# Patient Record
Sex: Female | Born: 1940 | Hispanic: No | Marital: Married | Smoking: Never smoker
Health system: Southern US, Community
[De-identification: ages and names within clinical notes are randomized; demographics above are authoritative.]

## PROBLEM LIST (undated history)

## (undated) DIAGNOSIS — K219 Gastro-esophageal reflux disease without esophagitis: Secondary | ICD-10-CM

## (undated) DIAGNOSIS — IMO0002 Reserved for concepts with insufficient information to code with codable children: Secondary | ICD-10-CM

## (undated) DIAGNOSIS — R9430 Abnormal result of cardiovascular function study, unspecified: Secondary | ICD-10-CM

## (undated) DIAGNOSIS — S322XXA Fracture of coccyx, initial encounter for closed fracture: Secondary | ICD-10-CM

## (undated) DIAGNOSIS — M1990 Unspecified osteoarthritis, unspecified site: Secondary | ICD-10-CM

## (undated) DIAGNOSIS — D649 Anemia, unspecified: Secondary | ICD-10-CM

## (undated) DIAGNOSIS — Q2730 Arteriovenous malformation, site unspecified: Secondary | ICD-10-CM

## (undated) DIAGNOSIS — E7800 Pure hypercholesterolemia, unspecified: Secondary | ICD-10-CM

## (undated) DIAGNOSIS — I358 Other nonrheumatic aortic valve disorders: Secondary | ICD-10-CM

## (undated) DIAGNOSIS — H9192 Unspecified hearing loss, left ear: Secondary | ICD-10-CM

## (undated) DIAGNOSIS — J189 Pneumonia, unspecified organism: Secondary | ICD-10-CM

## (undated) DIAGNOSIS — F419 Anxiety disorder, unspecified: Secondary | ICD-10-CM

## (undated) DIAGNOSIS — Z8719 Personal history of other diseases of the digestive system: Secondary | ICD-10-CM

## (undated) DIAGNOSIS — I10 Essential (primary) hypertension: Secondary | ICD-10-CM

## (undated) DIAGNOSIS — I340 Nonrheumatic mitral (valve) insufficiency: Secondary | ICD-10-CM

## (undated) HISTORY — DX: Nonrheumatic mitral (valve) insufficiency: I34.0

## (undated) HISTORY — DX: Other nonrheumatic aortic valve disorders: I35.8

## (undated) HISTORY — DX: Pure hypercholesterolemia, unspecified: E78.00

## (undated) HISTORY — DX: Arteriovenous malformation, site unspecified: Q27.30

## (undated) HISTORY — PX: OTHER SURGICAL HISTORY: SHX169

## (undated) HISTORY — DX: Anemia, unspecified: D64.9

## (undated) HISTORY — PX: CHOLECYSTECTOMY: SHX55

## (undated) HISTORY — PX: TONSILLECTOMY: SUR1361

## (undated) HISTORY — DX: Fracture of coccyx, initial encounter for closed fracture: S32.2XXA

## (undated) HISTORY — DX: Anxiety disorder, unspecified: F41.9

## (undated) HISTORY — DX: Abnormal result of cardiovascular function study, unspecified: R94.30

## (undated) HISTORY — DX: Essential (primary) hypertension: I10

## (undated) HISTORY — DX: Reserved for concepts with insufficient information to code with codable children: IMO0002

---

## 1965-09-28 DIAGNOSIS — J189 Pneumonia, unspecified organism: Secondary | ICD-10-CM

## 1965-09-28 HISTORY — DX: Pneumonia, unspecified organism: J18.9

## 1999-07-15 ENCOUNTER — Encounter: Admission: RE | Admit: 1999-07-15 | Discharge: 1999-10-13 | Payer: Self-pay | Admitting: Anesthesiology

## 1999-08-08 ENCOUNTER — Encounter: Admission: RE | Admit: 1999-08-08 | Discharge: 1999-08-08 | Payer: Self-pay | Admitting: Family Medicine

## 1999-08-12 ENCOUNTER — Other Ambulatory Visit: Admission: RE | Admit: 1999-08-12 | Discharge: 1999-08-12 | Payer: Self-pay | Admitting: Obstetrics and Gynecology

## 2000-04-22 ENCOUNTER — Encounter (INDEPENDENT_AMBULATORY_CARE_PROVIDER_SITE_OTHER): Payer: Self-pay

## 2000-04-22 ENCOUNTER — Other Ambulatory Visit: Admission: RE | Admit: 2000-04-22 | Discharge: 2000-04-22 | Payer: Self-pay | Admitting: Obstetrics and Gynecology

## 2000-09-08 ENCOUNTER — Other Ambulatory Visit: Admission: RE | Admit: 2000-09-08 | Discharge: 2000-09-08 | Payer: Self-pay | Admitting: Obstetrics and Gynecology

## 2001-06-23 ENCOUNTER — Encounter: Admission: RE | Admit: 2001-06-23 | Discharge: 2001-06-23 | Payer: Self-pay | Admitting: Orthopedic Surgery

## 2001-06-23 ENCOUNTER — Encounter: Payer: Self-pay | Admitting: Orthopedic Surgery

## 2001-08-11 ENCOUNTER — Encounter: Payer: Self-pay | Admitting: Orthopedic Surgery

## 2001-08-12 ENCOUNTER — Inpatient Hospital Stay (HOSPITAL_COMMUNITY): Admission: RE | Admit: 2001-08-12 | Discharge: 2001-08-13 | Payer: Self-pay | Admitting: Orthopedic Surgery

## 2001-10-27 ENCOUNTER — Other Ambulatory Visit: Admission: RE | Admit: 2001-10-27 | Discharge: 2001-10-27 | Payer: Self-pay | Admitting: Obstetrics and Gynecology

## 2002-12-12 ENCOUNTER — Other Ambulatory Visit: Admission: RE | Admit: 2002-12-12 | Discharge: 2002-12-12 | Payer: Self-pay | Admitting: Obstetrics and Gynecology

## 2003-03-05 ENCOUNTER — Encounter: Admission: RE | Admit: 2003-03-05 | Discharge: 2003-03-05 | Payer: Self-pay | Admitting: Gastroenterology

## 2003-03-05 ENCOUNTER — Encounter: Payer: Self-pay | Admitting: Gastroenterology

## 2003-03-26 ENCOUNTER — Encounter (INDEPENDENT_AMBULATORY_CARE_PROVIDER_SITE_OTHER): Payer: Self-pay | Admitting: *Deleted

## 2003-03-26 ENCOUNTER — Ambulatory Visit (HOSPITAL_COMMUNITY): Admission: RE | Admit: 2003-03-26 | Discharge: 2003-03-26 | Payer: Self-pay | Admitting: Gastroenterology

## 2004-01-24 ENCOUNTER — Other Ambulatory Visit: Admission: RE | Admit: 2004-01-24 | Discharge: 2004-01-24 | Payer: Self-pay | Admitting: Obstetrics and Gynecology

## 2005-03-11 ENCOUNTER — Other Ambulatory Visit: Admission: RE | Admit: 2005-03-11 | Discharge: 2005-03-11 | Payer: Self-pay | Admitting: Obstetrics and Gynecology

## 2005-10-02 ENCOUNTER — Ambulatory Visit: Payer: Self-pay | Admitting: Cardiology

## 2006-07-02 ENCOUNTER — Ambulatory Visit: Payer: Self-pay | Admitting: Cardiology

## 2006-07-12 ENCOUNTER — Ambulatory Visit: Payer: Self-pay | Admitting: Cardiology

## 2008-07-11 ENCOUNTER — Ambulatory Visit: Payer: Self-pay | Admitting: Cardiology

## 2008-09-28 HISTORY — PX: TOTAL KNEE ARTHROPLASTY: SHX125

## 2008-12-26 ENCOUNTER — Inpatient Hospital Stay (HOSPITAL_COMMUNITY): Admission: RE | Admit: 2008-12-26 | Discharge: 2008-12-30 | Payer: Self-pay | Admitting: Orthopedic Surgery

## 2009-01-01 ENCOUNTER — Telehealth: Payer: Self-pay | Admitting: Internal Medicine

## 2009-11-21 ENCOUNTER — Telehealth: Payer: Self-pay | Admitting: Cardiology

## 2010-01-08 ENCOUNTER — Telehealth: Payer: Self-pay | Admitting: Cardiology

## 2010-02-11 ENCOUNTER — Telehealth: Payer: Self-pay | Admitting: Cardiology

## 2010-02-21 ENCOUNTER — Encounter: Payer: Self-pay | Admitting: Cardiology

## 2010-02-21 DIAGNOSIS — I10 Essential (primary) hypertension: Secondary | ICD-10-CM | POA: Insufficient documentation

## 2010-02-21 DIAGNOSIS — E7800 Pure hypercholesterolemia, unspecified: Secondary | ICD-10-CM

## 2010-02-21 DIAGNOSIS — F411 Generalized anxiety disorder: Secondary | ICD-10-CM | POA: Insufficient documentation

## 2010-02-21 DIAGNOSIS — Z87448 Personal history of other diseases of urinary system: Secondary | ICD-10-CM | POA: Insufficient documentation

## 2010-02-25 ENCOUNTER — Ambulatory Visit: Payer: Self-pay | Admitting: Cardiology

## 2010-04-25 ENCOUNTER — Encounter: Payer: Self-pay | Admitting: Cardiology

## 2010-06-27 ENCOUNTER — Telehealth: Payer: Self-pay | Admitting: Cardiology

## 2010-10-26 ENCOUNTER — Encounter
Admission: RE | Admit: 2010-10-26 | Discharge: 2010-10-26 | Payer: Self-pay | Source: Home / Self Care | Attending: Otolaryngology | Admitting: Otolaryngology

## 2010-10-30 NOTE — Progress Notes (Signed)
Summary: REFILL MEDS  Phone Note Refill Request Call back at Home Phone [phone] Message from:  Patient on Feb 11, 2010 1:10 PM  Refills Requested: Medication #1:  TRIAMTERENE-HCTZ 37.5-25 MG TABS Take 1 tablet by mouth once a day.  Medication #2:  ACCUPRIL 20 MG TABS Take 1 tablet by mouth once a day [facility] [address] [phone]    Method Requested: Fax to Local Pharmacy Initial call taken by: Lorne Skeens,  Feb 11, 2010 1:11 PM    Prescriptions: TRIAMTERENE-HCTZ 37.5-25 MG TABS (TRIAMTERENE-HCTZ) Take 1 tablet by mouth once a day  #30 x 3   Entered by:   Hardin Negus, RMA   Authorized by:   Talitha Givens, MD, [facility]   Signed by:   Hardin Negus, RMA on 02/12/2010   Method used:   Electronically to        [facility] ([facility]       [address]       [place], [place]  [address]       Ph: [phone] or [phone]       Fax: [phone]   RxID:   [id number] ACCUPRIL 20 MG TABS (QUINAPRIL HCL) Take 1 tablet by mouth once a day  #30 x 3   Entered by:   Hardin Negus, RMA   Authorized by:   Talitha Givens, MD, [facility]   Signed by:   Hardin Negus, RMA on 02/12/2010   Method used:   Electronically to        [facility] (retail)       [address]       [place], [place]  [address]       Ph: [phone] or [phone]       Fax: [phone]   RxID:   [id number]

## 2010-10-30 NOTE — Progress Notes (Signed)
Summary: pt needs meds asap [place]   Phone Note Refill Request Call back at [facility] Phone [phone] Message from:  [facility]  Refills Requested: Medication #1:  TRIAMTERENE-HCTZ 37.5-25 MG TABS Take 1 tablet by mouth once a day. Initial call taken by: Omer Jack,  January 08, 2010 3:49 PM    Prescriptions: TRIAMTERENE-HCTZ 37.5-25 MG TABS (TRIAMTERENE-HCTZ) Take 1 tablet by mouth once a day  #90 x 1   Entered by:   Hardin Negus, RMA   Authorized by:   Talitha Givens, MD, [facility]   Signed by:   Hardin Negus, RMA on 01/08/2010   Method used:   Faxed to ...       [facility]             , [place]         Ph:        Fax: [phone]   RxID:   [id number]

## 2010-10-30 NOTE — Miscellaneous (Signed)
  Clinical Lists Changes  Problems: Added new problem of CHEST PAIN (ICD-786.50) Observations: Added new observation of PAST MED HX:  1. Hypertension.   2. Hypercholesterolemia.   3. Anxiety.   4. History of urinary tract infection.  Family history coronary disease Chest pain... stress nuclear... 2005.... no significant abnormalities    (02/21/2010 14:48)       Past History:  Past Medical History:  1. Hypertension.   2. Hypercholesterolemia.   3. Anxiety.   4. History of urinary tract infection.  Family history coronary disease Chest pain... stress nuclear... 2005.... no significant abnormalities

## 2010-10-30 NOTE — Progress Notes (Signed)
Summary: c/o lightheadness, sweaty  Phone Note Call from Patient Call back at Home Phone [phone]   Caller: Patient Reason for Call: Talk to Nurse Summary of Call: c/o lightheadness, sweaty, uneasy, no chestpain,  Initial call taken by: Lorne Skeens,  June 27, 2010 3:15 PM  Follow-up for Phone Call        Pt. felt lightheaded and flushed in face today while shopping.  No chest pain, numbness, SOB, nausea or sweating associated with this.  Drove self home and felt better after getting home.  States this happened last month and also on September 15. Previous 2 episodes associated with vomiting. She saw GI MD and was treated with Xifaxan.  Pt states blood pressure when last checked at MD's office was 130/78.  I asked her to contact her primary MD to be evaluated but she also wants appt with Dr. Myrtis Ser. Offered pt appt with Dr. Myrtis Ser on October 13th at 9 AM but she would like earlier appt.  Will forward to [facility] to see if she can schedule pt appt with Dr. Myrtis Ser.  Pt aware Herbert Seta is not in office today.  I instructed pt if lightheadedness were to increase or she had chest pain or facial or extremity numbness or tingling she should go to ED at [facility] to be seen. Dossie Arbour, RN, BSN  June 27, 2010 3:55 PM   Additional Follow-up for Phone Call Additional follow up Details #1::        spoke w/pt, she saw pcp today and had EKG they felt it was not her heart but acid reflux, and started her on med, if symptoms return she will call us back  Additional Follow-up by: Meredith Staggers, RN,  June 30, 2010 5:05 PM

## 2010-10-30 NOTE — Progress Notes (Signed)
Summary: refill meds,  mail to pt  Phone Note Refill Request Call back at Home Phone [phone] Message from:  Patient on November 21, 2009 11:57 AM  Atenolol 50 ; triamt/hctz 37.5 / 25 mg;  Accupril 20   Method Requested: Mail to Patient Initial call taken by: Lorne Skeens,  November 21, 2009 11:59 AM  Follow-up for Phone Call        tried to call to find out 30 or 90 day and if I could go a head and send it 2 the pharmacy Hardin Negus, RMA  November 21, 2009 2:55 PM   Pt called back -- sending 42 script with 1 refill will send a yr worth when she comes in on 3/7 Follow-up by: Hardin Negus, RMA,  November 21, 2009 3:20 PM    New/Updated Medications: ATENOLOL 50 MG TABS (ATENOLOL) Take one tablet by mouth daily ACCUPRIL 20 MG TABS (QUINAPRIL HCL) Take 1 tablet by mouth once a day TRIAMTERENE-HCTZ 37.5-25 MG TABS (TRIAMTERENE-HCTZ) Take 1 tablet by mouth once a day Prescriptions: TRIAMTERENE-HCTZ 37.5-25 MG TABS (TRIAMTERENE-HCTZ) Take 1 tablet by mouth once a day  #90 x 1   Entered by:   Hardin Negus, RMA   Authorized by:   Talitha Givens, MD, [facility]   Signed by:   Hardin Negus, RMA on 11/21/2009   Method used:   Faxed to ...       [facility]             , [place]         Ph:        Fax: [phone]   RxID:   [id number] ACCUPRIL 20 MG TABS (QUINAPRIL HCL) Take 1 tablet by mouth once a day  #90 x 1   Entered by:   Hardin Negus, RMA   Authorized by:   Talitha Givens, MD, [facility]   Signed by:   Hardin Negus, RMA on 11/21/2009   Method used:   Faxed to ...       [facility]order)             , [place]         Ph:        Fax: [phone]   RxID:   [id number] ATENOLOL 50 MG TABS (ATENOLOL) Take one tablet by mouth daily  #90 x 1   Entered by:   Hardin Negus, RMA   Authorized by:   Talitha Givens, MD, [facility]   Signed by:   Hardin Negus, RMA on 11/21/2009   Method  used:   Faxed to ...       [facility]order)             , [place]         Ph:        Fax: [phone]   RxID:   [id number]

## 2010-10-30 NOTE — Assessment & Plan Note (Signed)
Summary: rov/ gd   Visit Type:  Follow-up Primary Provider:  Marjory Lies, MD  CC:  chest pain.  History of Present Illness: The patient is seen for followup with history of chest pain and hypertension.  She is done well.  She is not having any significant chest pain.  She had total knee replacement and this has helped her be much more active.  At home her blood pressure is normal.  She admits that she ate a lot of salt recently and has felt a little swollen.  Her blood pressure reflects this today as it is elevated.  Current Medications (verified): 1)  Atenolol 50 Mg Tabs (Atenolol) .... Take One Tablet By Mouth Daily 2)  Accupril 20 Mg Tabs (Quinapril Hcl) .... Take 1 Tablet By Mouth Once A Day 3)  Triamterene-Hctz 37.5-25 Mg Tabs (Triamterene-Hctz) .... Take 1 Tablet By Mouth Once A Day 4)  Lipitor 20 Mg Tabs (Atorvastatin Calcium) .Marland Kitchen.. 1 By Mouth Daily 5)  Aspirin 81 Mg  Tabs (Aspirin) .Marland Kitchen.. 1 By Mouth Daily 6)  Vitamin C 500 Mg  Tabs (Ascorbic Acid) .Marland Kitchen.. 1 By Mouth Daily  Allergies (verified): No Known Drug Allergies  Past History:  Past Medical History: Last updated: 02/21/2010  1. Hypertension.   2. Hypercholesterolemia.   3. Anxiety.   4. History of urinary tract infection.  Family history coronary disease Chest pain... stress nuclear... 2005.... no significant abnormalities     Review of Systems       Patient denies fever, chills, headache, sweats, rash, change in vision, change in hearing, chest pain, cough, nausea vomiting, urinary symptoms.  All other systems are reviewed and are negative.  Vital Signs:  Patient profile:   70 year old female Height:      61 inches Weight:      159 pounds BMI:     30.15 Pulse rate:   86 / minute Resp:     16 per minute BP sitting:   162 / 88  (left arm)  Vitals Entered By: Marrion Coy, CNA (Feb 25, 2010 12:09 PM)  Physical Exam  General:  patient is overweight but stable. Eyes:  no xanthelasma. Neck:  no jugular  venous distention. Lungs:  lungs are clear.  Respiratory effort is nonlabored. Heart:  cardiac exam reveals S1-S2.  There are no clicks or significant murmurs. Abdomen:  abdomen is soft. Extremities:  no peripheral edema. Psych:  patient is oriented to person time and place.  Affect is normal.   Impression & Recommendations:  Problem # 1:  CHEST PAIN (ICD-786.50)  Her updated medication list for this problem includes:    Atenolol 50 Mg Tabs (Atenolol) .Marland Kitchen... Take one tablet by mouth daily    Accupril 20 Mg Tabs (Quinapril hcl) .Marland Kitchen... Take 1 tablet by mouth once a day    Aspirin 81 Mg Tabs (Aspirin) .Marland Kitchen... 1 by mouth daily The patient has had no recurring chest pain.  EKG is normal.  It is reviewed by me.  Orders: EKG w/ Interpretation (93000) T-2 View CXR (71020TC)  Problem # 2:  HYPERTENSION (ICD-401.9)  Her updated medication list for this problem includes:    Atenolol 50 Mg Tabs (Atenolol) .Marland Kitchen... Take one tablet by mouth daily    Accupril 20 Mg Tabs (Quinapril hcl) .Marland Kitchen... Take 1 tablet by mouth once a day    Triamterene-hctz 37.5-25 Mg Tabs (Triamterene-hctz) .Marland Kitchen... Take 1 tablet by mouth once a day    Aspirin 81 Mg Tabs (Aspirin) .Marland Kitchen... 1 by  mouth daily Blood pressure is higher than I would like to see it today.  She admits to increased salt intake.  She will call her salt back and she will check her blood pressure at home.  She does have a blood pressure cuff.  Cardiac status is stable.  Patient has not had a chest x-ray in approximately 8 years.  This will be arranged.  Patient Instructions: 1)  A chest x-ray takes a picture of the organs and structures inside the chest, including the heart, lungs, and blood vessels. This test can show several things, including, whether the heart is enlarged; whether fluid is building up in the lungs; and whether pacemaker / defibrillator leads are still in place. 2)  Your physician wants you to follow-up in:   1 year.  You will receive a reminder  letter in the mail two months in advance. If you don't receive a letter, please call our office to schedule the follow-up appointment. Prescriptions: TRIAMTERENE-HCTZ 37.5-25 MG TABS (TRIAMTERENE-HCTZ) Take 1 tablet by mouth once a day  #90 x 3   Entered by:   Meredith Staggers, RN   Authorized by:   Talitha Givens, MD, [facility]   Signed by:   Meredith Staggers, RN on 02/25/2010   Method used:   Print then Give to Patient   RxID:   [id number] TRIAMTERENE-HCTZ 37.5-25 MG TABS (TRIAMTERENE-HCTZ) Take 1 tablet by mouth once a day  #30 x 6   Entered by:   Meredith Staggers, RN   Authorized by:   Talitha Givens, MD, [facility]   Signed by:   Meredith Staggers, RN on 02/25/2010   Method used:   Print then Give to Patient   RxID:   [id number] ACCUPRIL 20 MG TABS (QUINAPRIL HCL) Take 1 tablet by mouth once a day  #90 x 3   Entered by:   Meredith Staggers, RN   Authorized by:   Talitha Givens, MD, [facility]   Signed by:   Meredith Staggers, RN on 02/25/2010   Method used:   Faxed to ...       [facility]order[facility]             , [place]         Ph:        Fax: [phone]   RxID:   [id number] ATENOLOL 50 MG TABS (ATENOLOL) Take one tablet by mouth daily  #90 x 3   Entered by:   Meredith Staggers, RN   Authorized by:   Talitha Givens, MD, [facility]   Signed by:   Meredith Staggers, RN on 02/25/2010   Method used:   Faxed to ...       [facility]             , [place]         Ph:        Fax: [phone]   RxID:   [id number]

## 2010-11-03 ENCOUNTER — Telehealth: Payer: Self-pay | Admitting: Cardiology

## 2010-11-13 NOTE — Progress Notes (Signed)
Summary: pt has washing sound in her ear  Phone Note Call from Patient Call back at Home Phone [phone]   Caller: Patient Reason for Call: Talk to Nurse, Talk to Doctor Summary of Call: pt is having a washing sound in her ear and she went to a ENT provider and had a mri done and it was ok so she was reading on the internet that it could be the blood flow and she wants to talk to someone about/lg Initial call taken by: Omer Jack,  November 03, 2010 4:07 PM  Follow-up for Phone Call        also saw allergist and was put on prednisone last Thur., she cont. to have a sound in her Left ear "bum, bum, bum" has been going on since last Mon., she states MRI was ok, BP ok, wants to know if Dr Myrtis Ser has any thoughts will review w/him tom Meredith Staggers, RN  November 03, 2010 5:47 PM   Additional Follow-up for Phone Call Additional follow up Details #1::        This sound in her ear is not from her heart or blood vessels.  I would be reassuring from my viewpoint.  I will be happy to see her for elective visit to followup.  I would recommend giving the prednisone a  chance to work.  Myrtis Ser  pt is aware Meredith Staggers, RN  November 04, 2010 1:58 PM

## 2010-11-21 ENCOUNTER — Other Ambulatory Visit: Payer: Self-pay | Admitting: Obstetrics and Gynecology

## 2010-11-27 ENCOUNTER — Telehealth: Payer: Self-pay | Admitting: Cardiology

## 2010-12-01 ENCOUNTER — Encounter: Payer: Self-pay | Admitting: Nurse Practitioner

## 2010-12-01 ENCOUNTER — Ambulatory Visit (INDEPENDENT_AMBULATORY_CARE_PROVIDER_SITE_OTHER): Payer: Self-pay | Admitting: Nurse Practitioner

## 2010-12-01 ENCOUNTER — Ambulatory Visit: Payer: Self-pay | Admitting: Physician Assistant

## 2010-12-01 DIAGNOSIS — I10 Essential (primary) hypertension: Secondary | ICD-10-CM

## 2010-12-04 NOTE — Progress Notes (Signed)
Summary: rapid heart beat  Phone Note Call from Patient Call back at Home Phone [phone]   Caller: Patient Reason for Call: Talk to Nurse Summary of Call: pt states she been having rapid heart beat. pt wants to know if she able to come in tomorrow. Initial call taken by: Roe Coombs,  November 27, 2010 11:49 AM  Follow-up for Phone Call        Pt. would like to be seen in the office soon. Pt states has been having broblems with her left ear. Her GYN said  that pt's pulse was faster in her left carotid artery, and her carotid arteries needed to be evaluated. Pt states the heart beat in her left side of neck is so strong, that keeps her awake during the night. An appointment was made with Alesia Banda PA for Monday 12/01/10 at 3:30 PM. Pt. aware. Follow-up by: Ollen Gross, RN, BSN,  November 27, 2010 2:27 PM

## 2010-12-09 NOTE — Assessment & Plan Note (Signed)
Summary: Cardiology Office Visit - Whooshing in Left Ear   Visit Type:  Follow-up Primary Provider:  Marjory Lies, MD  CC:  "hears" heart beat in ear.  History of Present Illness: 70 year old Asian female with prior history of hypertension.  Since mid January, she has been experiencing a whooshing sensation in her left ear.  She said is more or less constant though when she is not paying attention to it, like when she is white TV, she doesn't necessarily here it.  she has been evaluated by ENT and has already had what she says was an unrevealing MRI.  She has also been on a prednisone taper over the past month.  Despite this, the whooshing sound persists.  She was recently seen by her OB/GYN who listened to her neck and told her that the pulse after left carotid was at a greater rate then the pulse to her right carotid.  To clarify, patient says that the pulse was not palpated at that time just auscultated, and she was last told her breath.  Since that information was given to her, she has been fairly anxious about it has continued to hear the whooshing sound in her left ear.  She presents today for cardiology evaluation for this ongoing problem.  She denies any chest pain, PND, orthopnea, nausea, vomiting, dizziness, syncope, or lower extremity edema.  Further, she reports that her blood pressure is well controlled at home.  EKG  Procedure date:  12/01/2010  Findings:      regular sinus rhythm, 65, no acute ST or T changes   Current Medications (verified): 1)  Atenolol 50 Mg Tabs (Atenolol) .... Take One Tablet By Mouth Daily 2)  Accupril 20 Mg Tabs (Quinapril Hcl) .... Take 1 Tablet By Mouth Once A Day 3)  Triamterene-Hctz 37.5-25 Mg Tabs (Triamterene-Hctz) .... Take 1 Tablet By Mouth Once A Day 4)  Lipitor 20 Mg Tabs (Atorvastatin Calcium) .Marland Kitchen.. 1 By Mouth Daily 5)  Aspirin 81 Mg  Tabs (Aspirin) .Marland Kitchen.. 1 By Mouth Daily 6)  Vitamin C 500 Mg  Tabs (Ascorbic Acid) .Marland Kitchen.. 1 By Mouth Daily 7)   Prednisone 20 Mg Tabs (Prednisone) .... Taper Dose -- Now On 1 Tab -- Tomorrow Start 1/2 Tab For 5 Days Then Appt.  Allergies (verified): No Known Drug Allergies  Review of Systems       as per history of present illness.  She has had some insomnia related to being on a prednisone taper.  She does report anxiety over the swishing sensation in her ear.  Otherwise all systems reviewed and negative  Vital Signs:  Patient profile:   70 year old female Height:      61 inches Weight:      158 pounds BMI:     29.96 Pulse rate:   65 / minute BP sitting:   166 / 90  (left arm) Cuff size:   regular  Vitals Entered By: Hardin Negus, RMA (December 01, 2010 3:57 PM)  Physical Exam  General:  Well developed, well nourished, in no acute distress. Head:  HEENT: Normal Neck:  supple without bruits or JVD Lungs:  respirations regular and unlabored, clear to auscultation Heart:  regular S1, S2, no S3, S4, or murmurs. Abdomen:  round, soft, nontender, nondistended, bowel sounds present x4. Msk:  moves all extremities. Pulses:  pulses normal in all 4 extremities Extremities:  No clubbing or cyanosis.  no edema. Neurologic:  awake alert and oriented x3. Skin:  warm and  dry. Psych:  normal affect.   Impression & Recommendations:  Problem # 1:  HYPERTENSION (ICD-401.9) patient's blood pressure is elevated the office today however she reports it is more normal at home.  Therefore we'll not make any changes to her home regimen.  As for the whooshing sensation she experiences, I offered her reassurance.  There is no objective evidence of bruits or other vascular abnormalities within her neck, specifically on the left side, to explain her symptoms.  It should be noted that when she is not paying attention to the sound, she does not hear it.  She admits to actually turning the TV down or off just to determine whether or not the whooshing sensation has ceased altogethe, only to hear it when she seeks to.  I  suspect she is fairly focused on it and her history of anxiety may be playing a role.  She has additional evaluation pending by ENT.  Problem # 2:  ANXIETY (ICD-300.00) see above.  Other Orders: EKG w/ Interpretation (93000)  Patient Instructions: 1)  Your physician wants you to follow-up in:   6 MONTHS WITH DR.KATZ. You will receive a reminder letter in the mail two months in advance. If you don't receive a letter, please call our office to schedule the follow-up appointment. 2)  Your physician recommends that you continue on your current medications as directed. Please refer to the Current Medication list given to you today.

## 2010-12-18 ENCOUNTER — Telehealth: Payer: Self-pay | Admitting: Cardiology

## 2010-12-18 ENCOUNTER — Telehealth: Payer: Self-pay | Admitting: *Deleted

## 2010-12-18 DIAGNOSIS — I10 Essential (primary) hypertension: Secondary | ICD-10-CM

## 2010-12-18 MED ORDER — QUINAPRIL HCL 20 MG PO TABS: 20.0000 mg | ORAL_TABLET | Freq: Every day | ORAL | Status: DC

## 2010-12-18 MED ORDER — TRIAMTERENE-HCTZ 37.5-25 MG PO TABS: 1.0000 | ORAL_TABLET | Freq: Every day | ORAL | Status: DC

## 2010-12-18 MED ORDER — ATENOLOL 50 MG PO TABS: 50.0000 mg | ORAL_TABLET | Freq: Every day | ORAL | Status: DC

## 2010-12-18 NOTE — Telephone Encounter (Signed)
Pt was here 12-01-10 and said she was told her atenolol, hctz, and accupril would be [facility] and they told her they never got it-can it be called in asap?

## 2010-12-18 NOTE — Telephone Encounter (Signed)
[facility]

## 2010-12-22 ENCOUNTER — Other Ambulatory Visit: Payer: Self-pay | Admitting: Neurology

## 2010-12-22 DIAGNOSIS — M542 Cervicalgia: Secondary | ICD-10-CM

## 2010-12-22 DIAGNOSIS — G44209 Tension-type headache, unspecified, not intractable: Secondary | ICD-10-CM

## 2010-12-27 ENCOUNTER — Ambulatory Visit
Admission: RE | Admit: 2010-12-27 | Discharge: 2010-12-27 | Disposition: A | Discharge status: Home / Self Care | Payer: Medicare Other | Source: Ambulatory Visit | Attending: Neurology | Admitting: Neurology

## 2010-12-27 DIAGNOSIS — G44209 Tension-type headache, unspecified, not intractable: Secondary | ICD-10-CM

## 2010-12-27 DIAGNOSIS — M542 Cervicalgia: Secondary | ICD-10-CM

## 2011-01-07 LAB — HEMOGLOBIN AND HEMATOCRIT, BLOOD
HCT: 25 % — ABNORMAL LOW (ref 36.0–46.0)
HCT: 28.2 % — ABNORMAL LOW (ref 36.0–46.0)
Hemoglobin: 8.5 g/dL — ABNORMAL LOW (ref 12.0–15.0)
Hemoglobin: 9.6 g/dL — ABNORMAL LOW (ref 12.0–15.0)

## 2011-01-07 LAB — PROTIME-INR
INR: 1.7 — ABNORMAL HIGH (ref 0.00–1.49)
Prothrombin Time: 21.1 seconds — ABNORMAL HIGH (ref 11.6–15.2)
Prothrombin Time: 28.3 seconds — ABNORMAL HIGH (ref 11.6–15.2)

## 2011-01-08 LAB — URINALYSIS, ROUTINE W REFLEX MICROSCOPIC
Bilirubin Urine: NEGATIVE
Hgb urine dipstick: NEGATIVE
Ketones, ur: NEGATIVE mg/dL
Nitrite: NEGATIVE
Urobilinogen, UA: 0.2 mg/dL (ref 0.0–1.0)

## 2011-01-08 LAB — CBC
Hemoglobin: 13.1 g/dL (ref 12.0–15.0)
MCHC: 33.4 g/dL (ref 30.0–36.0)
MCV: 91.4 fL (ref 78.0–100.0)
RDW: 14.4 % (ref 11.5–15.5)

## 2011-01-08 LAB — COMPREHENSIVE METABOLIC PANEL
ALT: 22 U/L (ref 0–35)
CO2: 29 mEq/L (ref 19–32)
Calcium: 9.3 mg/dL (ref 8.4–10.5)
Creatinine, Ser: 0.74 mg/dL (ref 0.4–1.2)
GFR calc non Af Amer: >60 mL/min (ref >60)
Glucose, Bld: 108 mg/dL — ABNORMAL HIGH (ref 70–99)
Sodium: 145 mEq/L (ref 135–145)
Total Protein: 6.1 g/dL (ref 6.0–8.3)

## 2011-01-08 LAB — URINE CULTURE
Colony Count: 25000
Special Requests: NEGATIVE

## 2011-01-08 LAB — DIFFERENTIAL
Eosinophils Absolute: 0.2 10*3/uL (ref 0.0–0.7)
Lymphocytes Relative: 30 % (ref 12–46)
Lymphs Abs: 1.6 10*3/uL (ref 0.7–4.0)
Monocytes Relative: 7 % (ref 3–12)
Neutro Abs: 3.2 10*3/uL (ref 1.7–7.7)
Neutrophils Relative %: 60 % (ref 43–77)

## 2011-01-08 LAB — PROTIME-INR
INR: 1 (ref 0.00–1.49)
Prothrombin Time: 13 seconds (ref 11.6–15.2)

## 2011-01-08 LAB — APTT: aPTT: 30 seconds (ref 24–37)

## 2011-01-08 LAB — TYPE AND SCREEN
ABO/RH(D): B POS
Antibody Screen: NEGATIVE

## 2011-02-10 NOTE — Assessment & Plan Note (Signed)
[facility]                            CARDIOLOGY OFFICE NOTE   NAME:[name] Hamilton, [name] Hamilton                      MRN:          [id number]  DATE:07/11/2008                            DOB:          1941/04/13    [name] Hamilton is seen for cardiology followup.  I see her every 2 years.  She is doing well.  She has a history of hypertension that is treated.  She has a strong family history of coronary disease.  She has  hyperlipidemia and she takes Lipitor.  There are recent labs sent from  Dr. Mellody Life office and her HDL is good and her LDL is nicely treated.  She has had some chest pain and shortness breath over the years.  Her  last physiologic study was a Cardiolite in 2005 that showed no  significant abnormalities.  She is not having any significant chest pain  or shortness of breath at this time.  She has no syncope or presyncope.  She is fully active.  Unfortunately, she is having difficulty with her  knees and this limits her exercise level.  She may need knee surgery.   ADDITIONAL PROBLEM:  In review of her labs, her glucose was 111.  She  began asking me about this.  She mentioned that Dr. Doristine Counter was  considering using some medications.  She has considered this, but after  speaking with other friends she has decided that for now she will just  trial to lose some weight.  I have encouraged her strongly to lose  weight and to follow up with Dr. Doristine Counter on this very important issue.   ALLERGIES:  No known drug allergies.   MEDICATIONS:  1. Atenolol 50.  2. Accupril 20.  3. Lipitor 20.  4. Aspirin.  5. Multivitamins.   OTHER MEDICAL PROBLEMS:  See the list below.   REVIEW OF SYSTEMS:  She is not having any GI or GU symptoms.  She does  have a problem with her knee related to knee function.  She has no  fevers or chills.  Otherwise, her review of systems is negative.   PHYSICAL EXAMINATION:  VITAL SIGNS:  Blood pressure is 124/76 with a  pulse of  75.  The patient's weight is 170 pounds which is stable on our  scale.  GENERAL:  The patient is oriented to person, time, and place.  Affect is  normal.  HEENT:  No xanthelasma.  She has normal extraocular motion.  There are  no carotid bruits.  There is no jugular venous tension.  LUNGS:  Clear.  Respiratory effort is not labored.  CARDIAC:  An S1 with an S2.  There are no clicks or significant murmurs.  ABDOMEN:  Soft.  She has no peripheral edema.   EKG is done and reviewed.  She has sinus rhythm with no acute  abnormalities.   Problems include:  1. History of hypertension.  This is well treated.  2. Family history of coronary disease.  3. Hyperlipidemia.  She is to continue on her Lipitor at 20 mg.  4. Glucose in the range of 111.  See the discussion above.  I will not      be ordering any further tests.  She is strongly encouraged to lose      weight and to follow up with Dr. Doristine Counter.  5. History of some chest pain and shortness of breath over time.  This      is stable and she needs no further workup.   I will see her back for cardiology followup in 2 years.  We will review  her medications in 1 year as needed.     Luis Abed, MD, [facility]  Electronically Signed    JDK/MedQ  DD: 07/11/2008  DT: 07/12/2008  Job #: [id number]   cc:   Marjory Lies, M.D.

## 2011-02-10 NOTE — Op Note (Signed)
NAMEJUDYE, LORINO             ACCOUNT NO.:  [id number]   MEDICAL RECORD NO.:  [id number]          PATIENT TYPE:  INP   LOCATION:  0003                         FACILITY:  [facility]   PHYSICIAN:  Georges Lynch. Gioffre, M.D.DATE OF BIRTH:  1940-12-19   DATE OF PROCEDURE:  DATE OF DISCHARGE:                               OPERATIVE REPORT   [name] Hamilton is 70 years old and her main problem was degenerative arthritis  involving the right knee.   PREOPERATIVE DIAGNOSIS:  Severe degenerative arthritis with a varus  deformity of the right knee.   POSTOPERATIVE DIAGNOSIS:  Severe degenerative arthritis with a varus  deformity of the right knee.   OPERATION:  Right total knee arthroplasty utilizing DePuy system.  I  cemented all three components.  The sizes used were as follows.  I first  of all used vancomycin in the cement.  The femoral component was a size  2 right posterior stabilized type.  The tibial tray was a size 2.5.  The  tibial insert was a 10-mm thickness size 2 insert.  The patella was a  size 35 mm.   DESCRIPTION OF PROCEDURE:  Under general anesthesia, routine orthopedic  prep and drape was carried out of the right lower extremity.  The leg  was exsanguinated with Esmarch, tourniquet was elevated to 350 mmHg.  The knee was flexed.  An incision was made over the anterior aspect of  the right knee.  Bleeders identified and cauterized.  Two flaps were  created.  I then carried out a median parapatellar incision, reflected  the patella laterally, flexed the knee and did medial and lateral  meniscectomies and excised the anterior and posterior cruciate ligament.  An initial drill hole then was made in the intercondylar notch and #1  jig was inserted.  I removed 11 mm thickness off the distal femur.  Following that, the #2 jig was inserted for measurement purposes and we  measured the femur to be a size 2 right.  We then inserted our next jig  and did our anterior, posterior and  chamfering cuts for a size 2 right  posterior stabilized femur.  Following that, we prepared the tibia in  the usual fashion.  I removed 4 mm thickness off the articular surface  of the tibia utilizing intramedullary guides.  After that was completed,  we then did our tension measurements for our ligaments and we had  excellent tension and flexion and extension.  I then cut my keel cut out  of the proximal tibial plateau in the usual fashion.  We then cut the  notch cut out of the femur in the usual fashion.  Thoroughly irrigated  out the area and inserted my trial components.  Following that, we then  went on and did a resurfacing procedure on the patella in the usual  fashion.  Three drill holes were made in the patella for a size 35  patella.  Once we went through the trials, we removed all the components  and then cemented all 3 components in simultaneously, __________ was  used in the cement.  I removed all  loose pieces of cement.  We  thoroughly searched for loose pieces cement after the cement was  hardened.  We thoroughly waterpiked out the knee, irrigated the knee out  and then inserted our permanent tibial insert which is a rotating  platform size 2, 10 mm thickness.  We then reduced the knee, took the  knee through motion, had good stability, good motion.  I inserted a  Hemovac drain and the wound was closed in layers in usual fashion.  Sterile Neosporin dressing was applied.   SURGEON:  Dr. Darrelyn Hillock   ASSISTANT:  Dr. Marlowe Kays, MD.           ______________________________  Georges Lynch. Darrelyn Hillock, M.D.     RAG/MEDQ  D:  12/26/2008  T:  12/26/2008  Job:  [id number]   cc:   Marjory Lies, M.D.  Fax: [phone]   Luis Abed, MD, [facility]  [address]  [address]  [place]  [place] [address]

## 2011-02-13 NOTE — Discharge Summary (Signed)
NAMEAMORITA, [name] Hamilton             ACCOUNT NO.:  [id number]   MEDICAL RECORD NO.:  [id number]          PATIENT TYPE:  INP   LOCATION:  1513                         FACILITY:  [facility]   PHYSICIAN:  Georges Lynch. Gioffre, M.D.DATE OF BIRTH:  08/09/1941   DATE OF ADMISSION:  12/26/2008  DATE OF DISCHARGE:  12/30/2008                               DISCHARGE SUMMARY   Taken to surgery on December 26, 2008 at which time I did a right total  knee arthroplasty utilizing DePuy system.  She had a rather severe  arthritis involving her right knee.  Postop she was placed on the  Coumadin/heparin protocol.  She was started on the knee machine for  mobilization of her knee.  On December 27, 2008 she was doing well.  Her  Foley catheter was discontinued, her hemoglobin was 9.6, and her Hemovac  was discontinued.  The following day she was seen again, the wound  looked fine.  She had no particular complications, no calf pain, no  signs of any deep venous thrombosis.  December 29, 2008 it was planned on  discharging her Sunday.  She was up ambulating with a walker, hemoglobin  was 8.5, hematocrit 25.  She was seen on December 30, 2008.  She was  reported as doing well at that time.  Her INR was 2.9.  PT 32.4.  Oxygen  saturation was 96%.  Her blood pressure was stable at 89/59.  It was  elected to discharge her to be followed in the office.   PERTINENT LABORATORY FINDINGS:  The initial hemoglobin was 13.1,  hematocrit 11.4.  The differential was normal.  Her PT was 13.  INR  initially was 1 and that was monitored daily.  The PTT was reported as  30 initially.  Postop her INR was monitored as I mentioned.  On December 30, 2008 her INR was 2.9.  Her Coumadin was readjusted.  Her sodium was 145,  potassium 3.6, chloride 108, glucose 108, BUN 12, creatinine 0.74.  Urinalysis was negative.  The culture of her urine showed enterococcus.  She was treated appropriately.  The remaining studies that are on the  chart:  Her chest  x-ray was normal.  EKG was normal for her surgery.  She was on IV Ancef preop and her postop was on the usual protocol.   MEDICINES ON DISCHARGE:  1. Triamterene/HCTZ 37.5/25.  2. Atenolol 50 mg daily.  3. Lipitor 40 mg daily.  4. Quinapril 20 mg daily.  5. Multivitamins.  6. Ambien 10 mg for sleep.  7. She was discharged on Norco 5/325 one every 4 hours p.r.n. for      pain.  8. Robaxin 500 mg t.i.d. for spasms.  9. Coumadin 5 mg daily.  10.She was also given Zofran 4 mg for nausea prior to discharge.   DISCHARGE INSTRUCTIONS:  1. She will ambulate full weightbearing with a walker.  2. She will stay on her Coumadin, I had her on 5 mg a day, and that      will be adjusted accordingly.  She will have her INR managed  weekly.  3. See me in the office in 2 weeks for suture removal.           ______________________________  Georges Lynch. Darrelyn Hillock, M.D.     RAG/MEDQ  D:  01/25/2009  T:  01/25/2009  Job:  [id number]

## 2011-02-13 NOTE — H&P (Signed)
[name] Hamilton, [name] Hamilton             ACCOUNT NO.:  [id number]   MEDICAL RECORD NO.:  [id number]          PATIENT TYPE:  INP   LOCATION:                               FACILITY:  [facility]   PHYSICIAN:  Georges Lynch. Gioffre, M.D.DATE OF BIRTH:  05-05-1941   DATE OF ADMISSION:  12/26/2008  DATE OF DISCHARGE:                              HISTORY & PHYSICAL   CHIEF COMPLAINT:  Painful range of motion, right knee.   HISTORY OF PRESENT ILLNESS:  The patient is a 70 year old female who  will be admitted for a right total knee arthroplasty by Dr. Darrelyn Hillock.  The patient has significant arthritic changes in the knee.  The patient  has failed of viscosupplement and other conservative treatments.  The  patient has pain with standing and any weightbearing activities.  It has  significantly altered her activities of daily living.  The patient has  elected to proceed with a total knee arthroplasty.   ALLERGIES:  NO KNOWN DRUG ALLERGIES.   CURRENT MEDICATIONS:  1. Atenolol 50 mg once a day.  2. Triamterene/hydrochlorothiazide 37.5/25 mg once a day.  3. Quinapril 20 mg once a day.  4. Lipitor 40 mg once a day.  5. Aspirin 81 mg a day.  6. Vitamin C.  7. Multivitamins.  8. Ambien 10 mg at night.  9. Nexium p.r.n.   PRIMARY CARE PHYSICIAN:  Dr. Marjory Lies.   CARDIOLOGIST:  Dr. Myrtis Ser.   PAST MEDICAL HISTORY:  1. Hypertension.  2. Hypercholesterolemia.  3. Anxiety.  4. History of urinary tract infection.   REVIEW OF SYSTEMS:  NEUROLOGIC:  She states she had gets anxious ever  once in a while, mostly when she is deprived of sleep.  No other  neurologic complaints.  PULMONARY:  Unremarkable.  CARDIOVASCULAR:  She does have stable hypertension.  She has been on the  same medications for a while.  She had a stress test 5 years previous,  no recent chest pains or irregular heart rhythms.  GI:  Unremarkable.  She has had gallstones in the past.  She has had her  gallbladder removed.  She only uses the  Nexium occasionally when she has  spicy foods.  GU:  She has recently been tested and found to have asymptomatic  bacteremia, and she is just completing a treatment of Septra, cleaning  her up prior to surgery.  ENDOCRINE:  Unremarkable.  HEMATOLOGIC:  Unremarkable.   PAST SURGICAL HISTORY:  1. Cholecystectomy.  2. Lumbar spinal stenosis surgery without any complications.   FAMILY MEDICAL HISTORY:  Father is deceased from a cardiac arrest.  Mother is deceased from cancer.   SOCIAL HISTORY:  The patient is married, lives in a two-story house with  13 steps to the second floor.  She has two grown children.   PHYSICAL EXAMINATION:  VITAL SIGNS:  Height is 4 feet 11, weight is 161.  Blood pressure is 126/76, pulse of 72, respirations are 12, nonlabored.  The patient is afebrile.  GENERAL:  A healthy-appearing female, conscious, alert and appropriate,  appears to be a good historian.  HEENT:  Head was normocephalic.  Pupils equal, round and reactive.  NECK:  Supple.  No palpable lymphadenopathy.  Good range of motion.  CHEST:  Lung sounds were clear and equal bilaterally.  No wheezes, rales  or rhonchi.  HEART:  Regular rate and rhythm.  No murmurs.  ABDOMEN:  Soft.  Bowel sounds present.  UPPER EXTREMITIES:  Upper extremities had excellent range of motion.  Good motor strength.  LOWER EXTREMITIES:  Both hips had full extension, flexion up 120 with 20  degrees internal-external rotation.  Right knee was a little boggy  appearing, no signs of infection.  No effusion.  She was able to fully  extend it.  She could flex it back to 120 degrees, no instability.  She  did have crepitus.  She had medial joint pain.  Left knee; she was able  to fully extension.  She could flex it back 130, no instability.  Calves  were soft.  She had good motion of the ankles.  PERIPHERAL VASCULAR:  Carotid pulses were 2+, no bruits.  Radial pulses  were 2+.  Posterior tibial pulses were 2+.  She had no  lower extremity  edema or venous stasis changes.  BREAST/RECTAL/GU:  Deferred at this time.   IMPRESSION:  1. End-stage osteoarthritis of the right knee with bone-on-bone medial      compartment and patellofemoral changes.  2. Hypertension.  3. Hypercholesterolemia.  4. Anxiety.  5. Preoperative urinary tract infection on Septra   PLAN:  The patient will undergo all routine labs and tests prior to  having a right total knee arthroplasty by Dr. Darrelyn Hillock on December 26, 2008.  The patient has been cleared medically from Dr. Myrtis Ser for this surgical  procedure.      Jamelle Rushing, P.A.    ______________________________  Georges Lynch Darrelyn Hillock, M.D.    RWK/MEDQ  D:  12/05/2008  T:  12/06/2008  Job:  [id number]   cc:   Windy Fast A. Darrelyn Hillock, M.D.  Fax: [phone]

## 2011-02-13 NOTE — Op Note (Signed)
   [name], [name] Hamilton                         ACCOUNT NO.:  [id number]   MEDICAL RECORD NO.:  [id number]                   PATIENT TYPE:  AMB   LOCATION:  ENDO                                 FACILITY:  [facility]   PHYSICIAN:  Anselmo Rod, M.D.               DATE OF BIRTH:  Sep 11, 1941   DATE OF PROCEDURE:  03/26/2003  DATE OF DISCHARGE:                                 OPERATIVE REPORT   PROCEDURE:  Colonoscopy with snare polypectomy times one.   ENDOSCOPIST:  Anselmo Rod, M.D.   INSTRUMENT USED:  Olympus video colonoscope.   INDICATIONS FOR PROCEDURE:  Personal history of polyps in a 34- year-old  female.  Rule out recurrent polyps.   PREPROCEDURE PREPARATION:  Informed Consent was procured from the patient.  The patient was fasted for eight hours prior to the procedure and prepped  with a bottle of magnesium citrate and a gallon of Go-Lytely the night prior  to the procedure.   PREPROCEDURE PHYSICAL:  Patient with stable vital signs.  Neck supple.  Clear to auscultation.  S1, S2.  Abdomen soft with normal bowel sounds.   DESCRIPTION OF PROCEDURE:  The patient was placed in the left lateral  decubitus position and sedated with an additional 20 mg of Demerol and 1 mg  of Versed intravenously.  Once the patient was adequately sedated and  maintained on low-flow oxygen and continuous cardiac monitoring, the Olympus  video colonoscope was advanced into the rectum to the cecum without  difficulty.  The patient had a fairly good prep.  A small sessile polyp was  snared from 70 cm.  Rest of the colonic mucosa up to the terminal ileum  appeared normal.  The appendiceal orifice and ileocecal valve were clearly  visualized and photographed.  No masses, polyps, erosions, ulcerations were  seen in cecum, right colon or transverse colon.  Retroflexion in the rectum  revealed no abnormalities.   IMPRESSION:  Small sessile polyp snared from 70 cm.  Otherwise normal  colonoscopy up to  terminal ileum.   RECOMMENDATIONS:  1. Await pathology results.  2.     Avoid all non-steroidal's, including aspirin, for the next four weeks.  3. Outpatient follow up as need arises in the future.  4. Repeat colorectal cancer screening depending on pathology results.                                               Anselmo Rod, M.D.    JNM/MEDQ  D:  03/26/2003  T:  03/27/2003  Job:  [id number]   cc:   Teena Irani. Arlyce Dice, M.D.  [address]  [place]  [place] [address]  Fax: [phone]

## 2011-02-13 NOTE — Op Note (Signed)
   NAMENERY, KALISZ                         ACCOUNT NO.:  [id number]   MEDICAL RECORD NO.:  [id number]                   PATIENT TYPE:  AMB   LOCATION:  ENDO                                 FACILITY:  [facility]   PHYSICIAN:  Anselmo Rod, M.D.               DATE OF BIRTH:  12-20-1940   DATE OF PROCEDURE:  03/26/2003  DATE OF DISCHARGE:                                 OPERATIVE REPORT   PROCEDURE:  Esophagogastroduodenoscopy, endoscopy.   ENDOSCOPIST:  Anselmo Rod, M.D.   INSTRUMENT USED:  Olympus pan endoscope   INDICATIONS FOR PROCEDURE:  Abdominal pain in a 70- year-old female, rule  out peptic ulcer disease, esophagitis, gastritis, etc.   PREPROCEDURE PREPARATION:  Informed Consent was procured from the patient.  The patient fasted for eight hours prior to the procedure.   PREPROCEDURE PHYSICAL:  Patient with stable vital signs.  Neck supple.  Lungs clear to auscultation.  Heart S1, S2.  Abdomen soft with normal bowel  sounds.   DESCRIPTION OF PROCEDURE:  The patient was placed in left lateral decubitus  position, sedated with 60 mg of Demerol and 6 mg of Versed intravenously.  Once the patient was adequately sedated and maintained on low-flow oxygen  and continuous cardiac monitoring, the Olympus video pan endoscope was  advanced through the mouthpiece over the tongue into the esophagus under  direct vision.  The entire esophagus appeared normal with no evidence of  ring, stricture, mass, esophagitis or Barrett's mucosa.  The scope was then  advanced to the stomach.  The entire gastric mucosa and proximal small bowel  appeared normal.  Retroflexion of the high cardia region revealed no masses.   IMPRESSION:  Normal esophagogastroduodenoscopy.   RECOMMENDATIONS:  Proceed with the colonoscopy.  Further recommendations  made after the colonoscopy.                                               Anselmo Rod, M.D.    JNM/MEDQ  D:  03/26/2003  T:  03/27/2003   Job:  [id number]   cc:   Teena Irani. Arlyce Dice, M.D.  [address]  [place]  [place] [address]  Fax: [phone]

## 2011-02-13 NOTE — Op Note (Signed)
[facility]  Patient:    [name] Hamilton, [name] Hamilton Visit Number: [id number] MRN: [id number]          Service Type: Attending:  Georges Lynch. Darrelyn Hillock, M.D. Dictated by:   Georges Lynch Darrelyn Hillock, M.D. Proc. Date: 08/11/01                             Operative Report  PREOPERATIVE DIAGNOSIS:  1. Severe spinal stenosis at L4-5.  2. Moderate spinal stenosis at L5-S1.  3. Severe lateral recess stenosis bilaterally at L4-5 and L5-S1 with severe foraminal stenosis involving the L5 root.  POSTOPERATIVE DIAGNOSIS:  1. Severe spinal stenosis at L4-5.  2. Moderate spinal stenosis at L5-S1.  3. Severe lateral recess stenosis bilaterally at L4-5 and L5-S1 with severe foraminal stenosis involving the L5 root.  OPERATION/PROCEDURE:  1. Complete decompressive lumbar laminectomy at L4-5 and L5-S1.  2. Foraminotomies of the L5, DS 1 roots bilaterally.  3. Partial facetectomy on the right as well at L4-5, L5-S1.  SURGEON:  Georges Lynch. Darrelyn Hillock, M.D.  ASSISTANTPatricia Nettle, M.D.  DESCRIPTION OF PROCEDURE:  Under general anesthesia a routine orthopedic prep and drape of the lower back is carried out.  She had 1 g of IV Ancef.  At this time, two needles were placed in the back for loading placed purposes and an x-ray was taken.  Incision then was made over the L4-5 and L5 interspace and the muscle was separated from the lamina and spinous processes and self retained McCullough retractors were inserted.  Another x-ray was taken to verify our exact position.  We identified the sacrum and then the S1 space then the L4-5 space.  We then removed the spinous processes of L4 and L5 and a partial of L3.  We then went down to utilize the bur to bur down the lamina and then did complete laminectomies bilaterally.  We also went out and removed the pars on the right which was literally compressing down the L5 root region.  She had a severe compression of the root, the foramina was severely  narrowed down due to the collapse of the disk space of L5-S1.  We were able to then do complete decompression to the lateral recesses as well.  Great care was taken not to injure the underlying dura or nerve roots.  She had a very small, pinhead size, bleb in her dura over L4-5; and at that end of the procedure we simply put a little Surgicel and Gelfoam over that area.  There was no leakage of spinal fluid.  We thoroughly irrigated out our area after we did our decompression.  We had nicely exposed the S1 and the L5 roots.  There really was no disk space.  The disk space was completely collapsed at L5-S1.  We went out far laterally after burring down the lateral recess region with the bur to trace the L5 root out. We completely decompressed that root on the right.  That is all of her symptoms were on the right and she had some weakness of her dorsiflexion on the right.  Good hemostasis was obtained with the bipolar.  We then irrigated the wound again, and then placed a small piece of Surgicel over the little dura bleb at L4-5 which was central and then we placed thrombin soaked Gelfoam over the dura and closed the wound in layers in the usual fashion. Dictated by:   Georges Lynch Darrelyn Hillock, M.D. Attending:  Ronald A. Darrelyn Hillock, M.D. DD:  08/11/01 TD:  08/11/01 Job: [id number]

## 2011-02-13 NOTE — Assessment & Plan Note (Signed)
[name] Hamilton                              CARDIOLOGY OFFICE NOTE   NAME:[name], [name]                      MRN:          [id number]  DATE:07/12/2006                            DOB:          1941/06/20    [name] Hamilton is here for cardiology followup.  I saw her 2 years ago.  She is  quite stable and we arranged for a 2-year followup.  She is doing well.  She  is taking her medications and watching her fatty intake.  Her labs continue  to show good results.  She is not having any chest pain.  She is not having  any significant shortness of breath.   PAST MEDICAL HISTORY:   ALLERGIES:  No known drug allergies.   MEDICATIONS:  1. Atenolol 50.  2. Diazide once a day.  3. Accupril 20.  4. Lipitor 40.  5. Aspirin 81.  6. Multivitamin.   OTHER MEDICAL PROBLEMS:  See the list below.   REVIEW OF SYSTEMS:  [name] Hamilton does not have any significant GI or GU  symptoms.  Her review of systems is negative.   PHYSICAL EXAM:  She is well-developed, well-nourished.  The patient is  oriented to person, time, and place.  Affect is normal.  Blood pressure is 130/84 with a pulse of 74.  HEENT:  No xanthelasma.  She has normal extraocular motion.  There are no carotid bruits.  There is no jugular venous distension.  CARDIAC:  S1 with an S2.  There are no clicks or significant murmurs.  ABDOMEN:  Soft.  There are no masses or bruits.  She has good distal pulses.  There is no peripheral edema.   PROBLEMS:  1. Hypertension, treated.  2. Family history of coronary disease.  3. Hyperlipidemia, treated.  Most recent labs on Lipitor 40 reveal      triglycerides of 112, HDL of 58, and LDL of 84.  This is quite good for      her.  4. History of some chest pain and shortness of breath.  She is stable and      not having any symptoms.  Her last physiologic study was a Cardiolite      study in 2005 showing no significant abnormalities.   Overall, she is stable and  needs no further testing.            ______________________________  [name] Abed, MD, [facility]     JDK/MedQ  DD:  07/12/2006  DT:  07/13/2006  Job #:  [id number]   cc:   [name] Hamilton

## 2011-02-24 ENCOUNTER — Other Ambulatory Visit (HOSPITAL_COMMUNITY): Payer: Self-pay | Admitting: Interventional Radiology

## 2011-02-24 DIAGNOSIS — H9319 Tinnitus, unspecified ear: Secondary | ICD-10-CM

## 2011-02-27 ENCOUNTER — Other Ambulatory Visit (HOSPITAL_COMMUNITY): Payer: Self-pay | Admitting: Interventional Radiology

## 2011-02-27 ENCOUNTER — Ambulatory Visit (HOSPITAL_COMMUNITY)
Admission: RE | Admit: 2011-02-27 | Discharge: 2011-02-27 | Disposition: A | Discharge status: Home / Self Care | Payer: Medicare Other | Source: Ambulatory Visit | Attending: Interventional Radiology | Admitting: Interventional Radiology

## 2011-02-27 DIAGNOSIS — H93A9 Pulsatile tinnitus, unspecified ear: Secondary | ICD-10-CM

## 2011-02-27 DIAGNOSIS — H9319 Tinnitus, unspecified ear: Secondary | ICD-10-CM

## 2011-03-03 ENCOUNTER — Ambulatory Visit (HOSPITAL_COMMUNITY)
Admission: RE | Admit: 2011-03-03 | Discharge: 2011-03-03 | Disposition: A | Discharge status: Home / Self Care | Payer: Medicare Other | Source: Ambulatory Visit | Attending: Interventional Radiology | Admitting: Interventional Radiology

## 2011-03-03 ENCOUNTER — Other Ambulatory Visit (HOSPITAL_COMMUNITY): Payer: Self-pay | Admitting: Interventional Radiology

## 2011-03-03 DIAGNOSIS — H9319 Tinnitus, unspecified ear: Secondary | ICD-10-CM | POA: Insufficient documentation

## 2011-03-03 DIAGNOSIS — H93A9 Pulsatile tinnitus, unspecified ear: Secondary | ICD-10-CM

## 2011-03-03 DIAGNOSIS — I6529 Occlusion and stenosis of unspecified carotid artery: Secondary | ICD-10-CM | POA: Insufficient documentation

## 2011-03-03 DIAGNOSIS — H9209 Otalgia, unspecified ear: Secondary | ICD-10-CM | POA: Insufficient documentation

## 2011-03-03 LAB — POCT I-STAT, CHEM 8
BUN: 17 mg/dL (ref 6–23)
Chloride: 103 mEq/L (ref 96–112)
Creatinine, Ser: 1 mg/dL (ref 0.4–1.2)
Glucose, Bld: 109 mg/dL — ABNORMAL HIGH (ref 70–99)
HCT: 40 % (ref 36.0–46.0)
Potassium: 3.4 mEq/L — ABNORMAL LOW (ref 3.5–5.1)

## 2011-03-03 LAB — CBC
HCT: 39.4 % (ref 36.0–46.0)
Hemoglobin: 13.4 g/dL (ref 12.0–15.0)
MCH: 30.6 pg (ref 26.0–34.0)
MCHC: 34 g/dL (ref 30.0–36.0)
MCV: 90 fL (ref 78.0–100.0)

## 2011-03-03 MED ORDER — IOHEXOL 300 MG/ML  SOLN
200.0000 mL | Freq: Once | INTRAMUSCULAR | Status: AC | PRN
  Administered 2011-03-03: 65 mL via INTRAVENOUS

## 2011-03-16 ENCOUNTER — Telehealth: Payer: Self-pay | Admitting: Cardiology

## 2011-03-16 DIAGNOSIS — I10 Essential (primary) hypertension: Secondary | ICD-10-CM

## 2011-03-16 MED ORDER — ATENOLOL 50 MG PO TABS: 50.0000 mg | ORAL_TABLET | Freq: Every day | ORAL | Status: DC

## 2011-03-16 MED ORDER — TRIAMTERENE-HCTZ 37.5-25 MG PO TABS: 1.0000 | ORAL_TABLET | Freq: Every day | ORAL | Status: DC

## 2011-03-16 MED ORDER — QUINAPRIL HCL 20 MG PO TABS: 20.0000 mg | ORAL_TABLET | Freq: Every day | ORAL | Status: DC

## 2011-03-16 MED ORDER — ATORVASTATIN CALCIUM 20 MG PO TABS: 20.0000 mg | ORAL_TABLET | Freq: Every day | ORAL | Status: DC

## 2011-03-16 NOTE — Telephone Encounter (Signed)
Patient wants all medication sent to [facility] [phone]

## 2011-05-06 ENCOUNTER — Ambulatory Visit
Admission: RE | Admit: 2011-05-06 | Discharge: 2011-05-06 | Disposition: A | Discharge status: Home / Self Care | Payer: Medicare Other | Source: Ambulatory Visit | Attending: Gastroenterology | Admitting: Gastroenterology

## 2011-05-06 ENCOUNTER — Other Ambulatory Visit: Payer: Self-pay | Admitting: Gastroenterology

## 2011-05-06 MED ORDER — IOHEXOL 300 MG/ML  SOLN
100.0000 mL | Freq: Once | INTRAMUSCULAR | Status: AC | PRN
  Administered 2011-05-06: 100 mL via INTRAVENOUS

## 2011-05-08 ENCOUNTER — Encounter: Payer: Self-pay | Admitting: Cardiology

## 2011-05-15 ENCOUNTER — Other Ambulatory Visit (HOSPITAL_COMMUNITY): Payer: Self-pay | Admitting: Gastroenterology

## 2011-05-19 ENCOUNTER — Other Ambulatory Visit (HOSPITAL_COMMUNITY): Payer: Medicare Other

## 2011-05-27 ENCOUNTER — Other Ambulatory Visit (HOSPITAL_COMMUNITY): Payer: Self-pay | Admitting: Gastroenterology

## 2011-06-02 ENCOUNTER — Other Ambulatory Visit: Payer: Self-pay | Admitting: Gastroenterology

## 2011-06-02 DIAGNOSIS — R140 Abdominal distension (gaseous): Secondary | ICD-10-CM

## 2011-06-03 ENCOUNTER — Ambulatory Visit
Admission: RE | Admit: 2011-06-03 | Discharge: 2011-06-03 | Disposition: A | Discharge status: Home / Self Care | Payer: Medicare Other | Source: Ambulatory Visit | Attending: Gastroenterology | Admitting: Gastroenterology

## 2011-06-03 DIAGNOSIS — R140 Abdominal distension (gaseous): Secondary | ICD-10-CM

## 2011-06-04 ENCOUNTER — Ambulatory Visit: Payer: Medicare Other | Admitting: Cardiology

## 2011-06-12 ENCOUNTER — Encounter (HOSPITAL_COMMUNITY)
Admission: RE | Admit: 2011-06-12 | Discharge: 2011-06-12 | Disposition: A | Discharge status: Home / Self Care | Payer: Medicare Other | Source: Ambulatory Visit | Attending: Gastroenterology | Admitting: Gastroenterology

## 2011-06-12 DIAGNOSIS — R112 Nausea with vomiting, unspecified: Secondary | ICD-10-CM | POA: Insufficient documentation

## 2011-06-12 MED ORDER — TECHNETIUM TC 99M SULFUR COLLOID
2.0000 | Freq: Once | INTRAVENOUS | Status: AC | PRN
  Administered 2011-06-12: 2 via INTRAVENOUS

## 2011-08-18 ENCOUNTER — Ambulatory Visit: Payer: Medicare Other | Admitting: Cardiology

## 2011-09-29 HISTORY — PX: TENDON REPAIR: SHX5111

## 2011-11-04 ENCOUNTER — Other Ambulatory Visit (HOSPITAL_COMMUNITY): Payer: Self-pay | Admitting: Interventional Radiology

## 2011-11-04 DIAGNOSIS — H93A9 Pulsatile tinnitus, unspecified ear: Secondary | ICD-10-CM

## 2011-11-06 ENCOUNTER — Ambulatory Visit (HOSPITAL_COMMUNITY)
Admission: RE | Admit: 2011-11-06 | Discharge: 2011-11-06 | Disposition: A | Discharge status: Home / Self Care | Payer: Medicare Other | Source: Ambulatory Visit | Attending: Interventional Radiology | Admitting: Interventional Radiology

## 2011-11-06 DIAGNOSIS — H93A9 Pulsatile tinnitus, unspecified ear: Secondary | ICD-10-CM

## 2011-11-09 ENCOUNTER — Telehealth (HOSPITAL_COMMUNITY): Payer: Self-pay

## 2011-11-10 ENCOUNTER — Other Ambulatory Visit (HOSPITAL_COMMUNITY): Payer: Self-pay | Admitting: Interventional Radiology

## 2011-11-10 ENCOUNTER — Telehealth (HOSPITAL_COMMUNITY): Payer: Self-pay

## 2011-11-10 DIAGNOSIS — Q282 Arteriovenous malformation of cerebral vessels: Secondary | ICD-10-CM

## 2011-11-10 DIAGNOSIS — R42 Dizziness and giddiness: Secondary | ICD-10-CM

## 2011-11-10 DIAGNOSIS — H93A9 Pulsatile tinnitus, unspecified ear: Secondary | ICD-10-CM

## 2011-11-16 ENCOUNTER — Ambulatory Visit
Admission: RE | Admit: 2011-11-16 | Discharge: 2011-11-16 | Disposition: A | Discharge status: Home / Self Care | Payer: Medicare Other | Source: Ambulatory Visit | Attending: Interventional Radiology | Admitting: Interventional Radiology

## 2011-11-16 DIAGNOSIS — H93A9 Pulsatile tinnitus, unspecified ear: Secondary | ICD-10-CM

## 2011-11-16 DIAGNOSIS — Q282 Arteriovenous malformation of cerebral vessels: Secondary | ICD-10-CM

## 2011-11-16 DIAGNOSIS — R42 Dizziness and giddiness: Secondary | ICD-10-CM

## 2011-11-17 ENCOUNTER — Telehealth: Payer: Self-pay | Admitting: Cardiology

## 2011-11-17 NOTE — Telephone Encounter (Signed)
Pt BP running high last two days, was due for appt in February, katz full until April, pls advise

## 2011-11-17 NOTE — Telephone Encounter (Signed)
N/A.  LMTC. 

## 2011-11-18 NOTE — Telephone Encounter (Signed)
Pt was notified and agrees. 

## 2011-11-18 NOTE — Telephone Encounter (Signed)
Please tell the patient that I am not concerned about the blood pressure recordings. Having an MRI can be very stressful. Just encourage her to continue to take random blood pressures at home. Call us with the information over time.

## 2011-11-18 NOTE — Telephone Encounter (Signed)
Pt rtn call to debbie from yesterday, pls call [phone]

## 2011-11-18 NOTE — Telephone Encounter (Signed)
Pt is reporting that her bp was 165/105 when she was getting an mri but was having a lot of anxiety and 140/94 at the allergist.  At home it was 118/78 last night and 130/84, and 116/84 2 days ago at home.

## 2011-11-19 ENCOUNTER — Telehealth (HOSPITAL_COMMUNITY): Payer: Self-pay

## 2011-12-15 NOTE — Telephone Encounter (Signed)
Contacts         Type  Contact  Phone    11/09/2011 1:00 PM  Phone (Outgoing)  Dr. Doristine Counter (PCP)  [phone]    Left Message- Michael Litter left a message for Dr. Doristine Counter about pts having a MRI and stopping the BP meds

## 2011-12-15 NOTE — Telephone Encounter (Signed)
Contacts         Type  Contact  Phone    11/19/2011 10:51 AM  Phone (Outgoing)  [name], Capelle (Self)  [phone] (H)    Completed- spoke w/ pt told her her mri was at [facility] on the 26th at [facility] [address] at 915// justin scheduled

## 2011-12-15 NOTE — Telephone Encounter (Signed)
Contacts         Type  Contact  Phone    11/10/2011 11:21 AM  Phone (Outgoing)  Dysert, Miguelina (Self)  [phone] (H)    Completed- Called pt and set up open mri @ GI for 11-16-11// 115

## 2011-12-29 ENCOUNTER — Encounter: Payer: Self-pay | Admitting: Cardiology

## 2011-12-30 ENCOUNTER — Encounter: Payer: Self-pay | Admitting: Cardiology

## 2011-12-30 ENCOUNTER — Ambulatory Visit (INDEPENDENT_AMBULATORY_CARE_PROVIDER_SITE_OTHER): Payer: Medicare Other | Admitting: Cardiology

## 2011-12-30 VITALS — BP 120/84 | HR 76 | Ht 61.0 in | Wt 159.0 lb

## 2011-12-30 DIAGNOSIS — I999 Unspecified disorder of circulatory system: Secondary | ICD-10-CM | POA: Insufficient documentation

## 2011-12-30 DIAGNOSIS — I10 Essential (primary) hypertension: Secondary | ICD-10-CM

## 2011-12-30 MED ORDER — TRIAMTERENE-HCTZ 37.5-25 MG PO TABS: 1.0000 | ORAL_TABLET | Freq: Every day | ORAL | Status: DC

## 2011-12-30 MED ORDER — QUINAPRIL HCL 20 MG PO TABS: 20.0000 mg | ORAL_TABLET | Freq: Every day | ORAL | Status: DC

## 2011-12-30 MED ORDER — ATENOLOL 50 MG PO TABS
50.0000 mg | ORAL_TABLET | Freq: Every day | ORAL | Status: DC
Start: 2011-12-30 — End: 2013-02-03

## 2011-12-30 NOTE — Assessment & Plan Note (Signed)
Blood pressure is controlled. No change in therapy. 

## 2011-12-30 NOTE — Patient Instructions (Signed)
Your physician wants you to follow-up in: 1 year.   You will receive a reminder letter in the mail two months in advance. If you don't receive a letter, please call our office to schedule the follow-up appointment.  Your physician has recommended you make the following change in your medication: Restart your lipitor and let us know how you are doing on it and how you are sleeping.  Please call Debby, Dr Henrietta Hoover nurse at [phone].

## 2011-12-30 NOTE — Assessment & Plan Note (Signed)
There is been no recurrent significant chest pain. No further workup is needed.

## 2011-12-30 NOTE — Progress Notes (Signed)
HPI Patient is seen for cardiology followup. Historically I had seen her for chest pain but she's been stable. Also seen her for followup of her hypertension. She's done very well. She's not having any significant cardiac symptoms. She's had an abnormal sound in her ear. She's had a very extensive workup. It seems that she may have a variant communicating vessel found by interventional radiology. This will be followed over time.    Not on File  Current Outpatient Prescriptions  Medication Sig Dispense Refill  . aspirin 81 MG tablet Take 81 mg by mouth daily.        Marland Kitchen atenolol (TENORMIN) 50 MG tablet Take 1 tablet (50 mg total) by mouth daily.  90 tablet  2  . quinapril (ACCUPRIL) 20 MG tablet Take 1 tablet (20 mg total) by mouth daily.  90 tablet  2  . triamterene-hydrochlorothiazide (MAXZIDE-25) 37.5-25 MG per tablet Take 1 tablet by mouth daily.  90 tablet  2  . vitamin C (ASCORBIC ACID) 500 MG tablet Take 500 mg by mouth daily.        Marland Kitchen atorvastatin (LIPITOR) 20 MG tablet Take 1 tablet (20 mg total) by mouth daily.  90 tablet  2    History   Social History  . Marital Status: Married    Spouse Name: N/A    Number of Children: 2  . Years of Education: N/A   Occupational History  . Not on file.   Social History Main Topics  . Smoking status: Never Smoker   . Smokeless tobacco: Not on file  . Alcohol Use: Not on file  . Drug Use: Not on file  . Sexually Active: Not on file   Other Topics Concern  . Not on file   Social History Narrative  . No narrative on file    Family History  Problem Relation Age of Onset  . Heart attack Father   . Cancer Mother     Past Medical History  Diagnosis Date  . HTN (hypertension)   . Hypercholesteremia   . Anxiety   . UTI (urinary tract infection)     hx of  . Chest pain     Nuclear, 2005, normal    Past Surgical History  Procedure Date  . Cholecystectomy   . Lumbar spinal stenosis surgery without any complications     ROS   Patient denies fever, chills, headache, sweats, rash, change in vision, change in hearing, chest pain, cough, nausea vomiting, urinary symptoms. All other systems are reviewed and are negative.  PHYSICAL EXAM Patient is stable. She is overweight. She is oriented to person time and place. Affect is normal. Head is atraumatic. There is no jugular venous distention. Lungs are clear. Respiratory effort is not labored. Cardiac exam reveals S1 and S2. There no clicks or significant murmurs. The abdomen is soft. There is no peripheral edema. There is no musculoskeletal deformities. There are no skin rashes.  Filed Vitals:   12/30/11 1430  BP: 120/84  Pulse: 76  Height: 5\' 1"  (1.549 m)  Weight: 159 lb (72.122 kg)   EKG is done today and reviewed by me. There is no significant change. There is mild decrease in anterior R wave progression. There is no change from prior EKG.  ASSESSMENT & PLAN

## 2011-12-30 NOTE — Assessment & Plan Note (Signed)
The question of a vascular abnormality will be followed over time by interventional radiology.

## 2012-05-24 ENCOUNTER — Encounter: Payer: Self-pay | Admitting: Cardiology

## 2012-06-13 ENCOUNTER — Telehealth: Payer: Self-pay

## 2012-06-13 DIAGNOSIS — Q238 Other congenital malformations of aortic and mitral valves: Secondary | ICD-10-CM

## 2012-06-13 NOTE — Telephone Encounter (Signed)
Ok per Dr Myrtis Ser to order an echo to evaluate calcification of the post mv leaflet.  Pt notified.

## 2012-06-13 NOTE — Telephone Encounter (Signed)
Agree 

## 2012-06-13 NOTE — Telephone Encounter (Signed)
[name] Hamilton states that she had a screening echo at a health fair.  The physician who did it told her she has calcification of the posterior MV leaftlet.  He recommended that she make an appt for a full echo and a cardiac consult with him.  She is requesting that Dr Myrtis Ser order an echo and advise if further workup/treatment is indicated.

## 2012-06-17 ENCOUNTER — Ambulatory Visit (HOSPITAL_COMMUNITY): Payer: Medicare Other | Attending: Cardiology | Admitting: Radiology

## 2012-06-17 DIAGNOSIS — I059 Rheumatic mitral valve disease, unspecified: Secondary | ICD-10-CM | POA: Insufficient documentation

## 2012-06-17 DIAGNOSIS — I379 Nonrheumatic pulmonary valve disorder, unspecified: Secondary | ICD-10-CM | POA: Insufficient documentation

## 2012-06-17 DIAGNOSIS — I10 Essential (primary) hypertension: Secondary | ICD-10-CM | POA: Insufficient documentation

## 2012-06-17 DIAGNOSIS — R072 Precordial pain: Secondary | ICD-10-CM | POA: Insufficient documentation

## 2012-06-17 DIAGNOSIS — Q238 Other congenital malformations of aortic and mitral valves: Secondary | ICD-10-CM

## 2012-06-17 DIAGNOSIS — I369 Nonrheumatic tricuspid valve disorder, unspecified: Secondary | ICD-10-CM | POA: Insufficient documentation

## 2012-06-17 NOTE — Progress Notes (Signed)
Echocardiogram performed.  

## 2012-06-21 ENCOUNTER — Telehealth: Payer: Self-pay | Admitting: Cardiology

## 2012-06-21 NOTE — Telephone Encounter (Signed)
Will forward to Dr Myrtis Ser for results.

## 2012-06-21 NOTE — Telephone Encounter (Signed)
Patient calling for test results [phone], she will be home until 1pm today

## 2012-06-24 NOTE — Telephone Encounter (Signed)
**Note De-Identified [name] Obfuscation** Pt's husband advised, he verbalized understanding and stated he will advise pt. Appt. scheduled for 12-3./LV

## 2012-06-24 NOTE — Telephone Encounter (Signed)
Please tell her that there are mild changes affecting her mitral and aortic valves. These are very mild. There is nothing to be concerned about. I will follow this over time with her. Make her an appointment in 2 months to come in to talk to me about it

## 2012-08-26 ENCOUNTER — Encounter: Payer: Self-pay | Admitting: Cardiology

## 2012-08-26 DIAGNOSIS — R9430 Abnormal result of cardiovascular function study, unspecified: Secondary | ICD-10-CM | POA: Insufficient documentation

## 2012-08-26 DIAGNOSIS — I358 Other nonrheumatic aortic valve disorders: Secondary | ICD-10-CM | POA: Insufficient documentation

## 2012-08-26 DIAGNOSIS — I340 Nonrheumatic mitral (valve) insufficiency: Secondary | ICD-10-CM | POA: Insufficient documentation

## 2012-08-30 ENCOUNTER — Encounter: Payer: Self-pay | Admitting: Cardiology

## 2012-08-30 ENCOUNTER — Ambulatory Visit (INDEPENDENT_AMBULATORY_CARE_PROVIDER_SITE_OTHER): Payer: Medicare Other | Admitting: Cardiology

## 2012-08-30 VITALS — BP 160/84 | HR 74 | Wt 163.0 lb

## 2012-08-30 DIAGNOSIS — I340 Nonrheumatic mitral (valve) insufficiency: Secondary | ICD-10-CM

## 2012-08-30 DIAGNOSIS — I059 Rheumatic mitral valve disease, unspecified: Secondary | ICD-10-CM

## 2012-08-30 DIAGNOSIS — I359 Nonrheumatic aortic valve disorder, unspecified: Secondary | ICD-10-CM

## 2012-08-30 DIAGNOSIS — I358 Other nonrheumatic aortic valve disorders: Secondary | ICD-10-CM

## 2012-08-30 NOTE — Assessment & Plan Note (Signed)
I had a complete discussion about the echo findings. I explained that she does have some calcification of her leaflets. There is no significant aortic stenosis. I explained to her that this is something we would follow over time. It does not represent a significant abnormality for her at this point.

## 2012-08-30 NOTE — Assessment & Plan Note (Signed)
I explained to the patient what in your calcification is. I explained to her that she had mild mitral regurgitation. I explained that I am not worried about a week and followed over time. She seemed reassured.

## 2012-08-30 NOTE — Progress Notes (Signed)
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 The patient has been doing very well. She was at a very nice cardiac screening session and a limited echo raised the question of some annular calcification. She contacted me in the decision was made to do a full and complete echo. The study was done. Today I've had a full discussion with her about it. She has moderate mitral annular calcification. There is no functional mitral stenosis. There is very mild mitral regurgitation. She has aortic valve sclerosis but no stenosis.  No Known Allergies  Current Outpatient Prescriptions  Medication Sig Dispense Refill  . aspirin 81 MG tablet Take 81 mg by mouth daily.        Marland Kitchen atenolol (TENORMIN) 50 MG tablet Take 1 tablet (50 mg total) by mouth daily.  90 tablet  3  . atorvastatin (LIPITOR) 20 MG tablet Take 1 tablet (20 mg total) by mouth daily.  90 tablet  2  . quinapril (ACCUPRIL) 20 MG tablet Take 1 tablet (20 mg total) by mouth daily.  90 tablet  3  . triamterene-hydrochlorothiazide (MAXZIDE-25) 37.5-25 MG per tablet Take 1 each (1 tablet total) by mouth daily.  90 tablet  3  . vitamin C (ASCORBIC ACID) 500 MG tablet Take 500 mg by mouth daily.        Marland Kitchen zolpidem (AMBIEN) 10 MG tablet prn        History   Social History  . Marital Status: Married    Spouse Name: N/A    Number of Children: 2  . Years of Education: N/A   Occupational History  . Not on file.   Social History Main Topics  . Smoking status: Never Smoker   . Smokeless tobacco: Not on file  . Alcohol Use: Not on file  . Drug Use: Not on file  . Sexually Active: Not on file   Other Topics Concern  . Not on file   Social History Narrative  . No narrative on file    Family History  Problem Relation Age of Onset  . Heart attack Father   . Cancer Mother     Past Medical History  Diagnosis Date  . HTN (hypertension)   . Hypercholesteremia   . Anxiety   . UTI (urinary  tract infection)     hx of  . Chest pain     Nuclear, 2005, normal  . Vascular abnormality     Interventional radiology images raise the question of the beginning of possible an AVM it may be related to sensation that she hears in her ear  . Ejection fraction     EF 55-60%, echo, September, 2013  . Aortic valve sclerosis     Moderate calcification of the leaflets, echo, September, 2013,  no significant aortic stenosis.  . Mitral regurgitation     Moderate annular calcification, echo, September, 2013, mild  regurgitation    Past Surgical History  Procedure Date  . Cholecystectomy   . Lumbar spinal stenosis surgery without any complications     Patient Active Problem List  Diagnosis  . HYPERCHOLESTEROLEMIA  . ANXIETY  . HYPERTENSION  . UTI'S, HX OF  . Chest pain  . Vascular abnormality  . Ejection fraction  . Aortic valve sclerosis  . Mitral regurgitation    ROS   Today the patient denies fever, chills, headache, sweats, rash, change in vision, change in hearing, chest pain, cough, nausea vomiting, urinary symptoms. All other  systems are reviewed and are negative.  PHYSICAL EXAM  Patient is oriented to person time and place. Affect is normal. There is no jugulovenous distention. Lungs are clear. Respiratory effort is nonlabored. Cardiac exam reveals S1 and S2. There are no clicks. There is a 2/6 systolic murmur. The abdomen is soft. Is no peripheral edema.  Filed Vitals:   08/30/12 1353  BP: 160/84  Pulse: 74  Weight: 163 lb (73.936 kg)     ASSESSMENT & PLAN

## 2012-08-30 NOTE — Patient Instructions (Addendum)
Your physician wants you to follow-up in: 1 year. You will receive a reminder letter in the mail two months in advance. If you don't receive a letter, please call our office to schedule the follow-up appointment.  

## 2013-02-03 ENCOUNTER — Encounter (HOSPITAL_COMMUNITY): Payer: Self-pay | Admitting: Pharmacy Technician

## 2013-02-08 ENCOUNTER — Encounter (HOSPITAL_COMMUNITY): Payer: Self-pay

## 2013-02-08 ENCOUNTER — Encounter (HOSPITAL_COMMUNITY)
Admission: RE | Admit: 2013-02-08 | Discharge: 2013-02-08 | Disposition: A | Discharge status: Home / Self Care | Payer: Medicare Other | Source: Ambulatory Visit | Attending: Orthopedic Surgery | Admitting: Orthopedic Surgery

## 2013-02-08 ENCOUNTER — Ambulatory Visit (HOSPITAL_COMMUNITY)
Admission: RE | Admit: 2013-02-08 | Discharge: 2013-02-08 | Disposition: A | Discharge status: Home / Self Care | Payer: Medicare Other | Source: Ambulatory Visit | Attending: Surgical | Admitting: Surgical

## 2013-02-08 DIAGNOSIS — Z01810 Encounter for preprocedural cardiovascular examination: Secondary | ICD-10-CM | POA: Insufficient documentation

## 2013-02-08 DIAGNOSIS — Z01812 Encounter for preprocedural laboratory examination: Secondary | ICD-10-CM | POA: Insufficient documentation

## 2013-02-08 DIAGNOSIS — Z01818 Encounter for other preprocedural examination: Secondary | ICD-10-CM | POA: Insufficient documentation

## 2013-02-08 DIAGNOSIS — Z0183 Encounter for blood typing: Secondary | ICD-10-CM | POA: Insufficient documentation

## 2013-02-08 DIAGNOSIS — I10 Essential (primary) hypertension: Secondary | ICD-10-CM | POA: Insufficient documentation

## 2013-02-08 DIAGNOSIS — I059 Rheumatic mitral valve disease, unspecified: Secondary | ICD-10-CM | POA: Insufficient documentation

## 2013-02-08 HISTORY — DX: Gastro-esophageal reflux disease without esophagitis: K21.9

## 2013-02-08 HISTORY — DX: Unspecified hearing loss, left ear: H91.92

## 2013-02-08 HISTORY — DX: Unspecified osteoarthritis, unspecified site: M19.90

## 2013-02-08 HISTORY — DX: Pneumonia, unspecified organism: J18.9

## 2013-02-08 LAB — COMPREHENSIVE METABOLIC PANEL
ALT: 14 U/L (ref 0–35)
AST: 24 U/L (ref 0–37)
Albumin: 4 g/dL (ref 3.5–5.2)
Alkaline Phosphatase: 72 U/L (ref 39–117)
BUN: 14 mg/dL (ref 6–23)
CO2: 30 mEq/L (ref 19–32)
Calcium: 9.9 mg/dL (ref 8.4–10.5)
Chloride: 101 mEq/L (ref 96–112)
Creatinine, Ser: 0.8 mg/dL (ref 0.50–1.10)
GFR calc Af Amer: 85 mL/min — ABNORMAL LOW (ref >90)
GFR calc non Af Amer: 74 mL/min — ABNORMAL LOW (ref >90)
Glucose, Bld: 99 mg/dL (ref 70–99)
Potassium: 3.7 mEq/L (ref 3.5–5.1)
Sodium: 140 mEq/L (ref 135–145)
Total Bilirubin: 0.6 mg/dL (ref 0.3–1.2)
Total Protein: 7.4 g/dL (ref 6.0–8.3)

## 2013-02-08 LAB — URINALYSIS, ROUTINE W REFLEX MICROSCOPIC
Bilirubin Urine: NEGATIVE
Glucose, UA: NEGATIVE mg/dL
Hgb urine dipstick: NEGATIVE
Ketones, ur: NEGATIVE mg/dL
Nitrite: NEGATIVE
Protein, ur: NEGATIVE mg/dL
Specific Gravity, Urine: 1.012 (ref 1.005–1.030)
Urobilinogen, UA: 0.2 mg/dL (ref 0.0–1.0)
pH: 7.5 (ref 5.0–8.0)

## 2013-02-08 LAB — CBC
HCT: 40.9 % (ref 36.0–46.0)
Hemoglobin: 13.8 g/dL (ref 12.0–15.0)
MCV: 88 fL (ref 78.0–100.0)
RBC: 4.65 MIL/uL (ref 3.87–5.11)
WBC: 6.5 10*3/uL (ref 4.0–10.5)

## 2013-02-08 LAB — PROTIME-INR
INR: 0.98 (ref 0.00–1.49)
Prothrombin Time: 12.9 seconds (ref 11.6–15.2)

## 2013-02-08 LAB — URINE MICROSCOPIC-ADD ON

## 2013-02-08 LAB — APTT: aPTT: 35 seconds (ref 24–37)

## 2013-02-08 NOTE — Patient Instructions (Addendum)
22 [name] Hamilton  02/08/2013   Your procedure is scheduled on: 02/16/13  Report to [facility] Stay Center at 10:00 AM.  Call this number if you have problems the morning of surgery 336-: [phone]   Remember:   Do not eat food or drink liquids After Midnight.     Take these medicines the morning of surgery with A SIP OF WATER: atenolol/tenormin   Do not wear jewelry, make-up or nail polish.  Do not wear lotions, powders, or perfumes. You may wear deodorant.  Do not shave 48 hours prior to surgery. Men may shave face and neck.  Do not bring valuables to the hospital.  Contacts, dentures or bridgework may not be worn into surgery.  Leave suitcase in the car. After surgery it may be brought to your room.  For patients admitted to the hospital, checkout time is 11:00 AM the day of discharge.    Please read over the following fact sheets that you were given: MRSA Information, incentive spirometry fact sheet, blood fact sheet Birdie Sons, RN  pre op nurse call if needed [phone]    FAILURE TO FOLLOW THESE INSTRUCTIONS MAY RESULT IN CANCELLATION OF YOUR SURGERY   Patient Signature: ___________________________________________

## 2013-02-09 LAB — URINE CULTURE: Colony Count: NO GROWTH

## 2013-02-12 NOTE — H&P (Signed)
TOTAL KNEE ADMISSION H&P  Patient is being admitted for left total knee arthroplasty.  Subjective:  Chief Complaint:left knee pain.  HPI: [name] Hamilton, 72 y.o. female, has a history of pain and functional disability in the left knee due to arthritis and has failed non-surgical conservative treatments for greater than 12 weeks to includeNSAID's and/or analgesics, corticosteriod injections and activity modification.  Onset of symptoms was gradual, starting 2 years ago with gradually worsening course since that time. The patient noted no past surgery on the left knee(s).  Patient currently rates pain in the left knee(s) at 6 out of 10 with activity. Patient has night pain, worsening of pain with activity and weight bearing, pain that interferes with activities of daily living, pain with passive range of motion, crepitus and joint swelling.  Patient has evidence of periarticular osteophytes and joint space narrowing by imaging studies.  There is no active infection.  Patient Active Problem List   Diagnosis Date Noted  . Ejection fraction   . Aortic valve sclerosis   . Mitral regurgitation   . Vascular abnormality   . Chest pain   . HYPERCHOLESTEROLEMIA 02/21/2010  . ANXIETY 02/21/2010  . HYPERTENSION 02/21/2010  . UTI'S, HX OF 02/21/2010   Past Medical History  Diagnosis Date  . HTN (hypertension)   . Hypercholesteremia   . Anxiety   . UTI (urinary tract infection)     hx of  . Chest pain     Nuclear, 2005, normal  . Vascular abnormality     Interventional radiology images raise the question of the beginning of possible an AVM it may be related to sensation that she hears in her ear  . Ejection fraction     EF 55-60%, echo, September, 2013  . Aortic valve sclerosis     Moderate calcification of the leaflets, echo, September, 2013,  no significant aortic stenosis.  . Mitral regurgitation     Moderate annular calcification, echo, September, 2013, mild  regurgitation  . Pneumonia  1967    hx of  . GERD (gastroesophageal reflux disease)   . Arthritis   . Hearing loss of left ear     Past Surgical History  Procedure Laterality Date  . Cholecystectomy    . Lumbar spinal stenosis surgery without any complications    . Cardiac catheterization    . Tendon repair Left 2013    in forarm  . Joint replacement Right 2010    knee    Current outpatient prescriptions: aspirin 81 MG tablet, Take 81 mg by mouth at bedtime. , Disp: , Rfl: ;  atenolol (TENORMIN) 50 MG tablet, Take 50 mg by mouth every morning., Disp: , Rfl: ;  atorvastatin (LIPITOR) 20 MG tablet, Take 20 mg by mouth at bedtime., Disp: , Rfl: ;  KRILL OIL PO, Take 1 capsule by mouth daily., Disp: , Rfl: ;  quinapril (ACCUPRIL) 20 MG tablet, Take 20 mg by mouth at bedtime., Disp: , Rfl:  triamterene-hydrochlorothiazide (MAXZIDE-25) 37.5-25 MG per tablet, Take 1 tablet by mouth every morning., Disp: , Rfl: ;  zolpidem (AMBIEN) 10 MG tablet, Take 5 mg by mouth at bedtime as needed for sleep. prn, Disp: , Rfl:   Allergies  Allergen Reactions  . Adhesive (Tape) Rash    History  Substance Use Topics  . Smoking status: Never Smoker   . Smokeless tobacco: Never Used  . Alcohol Use: Yes     Comment: socially    Family History  Problem Relation Age of Onset  .  Heart attack Father   . Cancer Mother      Review of Systems  Constitutional: Negative.   HENT: Negative.  Negative for neck pain.   Eyes: Negative.   Respiratory: Negative.   Cardiovascular: Negative.   Gastrointestinal: Negative.   Genitourinary: Negative.   Musculoskeletal: Positive for back pain and joint pain. Negative for myalgias and falls.       Left knee pain  Skin: Negative.   Neurological: Negative.   Endo/Heme/Allergies: Negative.   Psychiatric/Behavioral: Negative for depression, suicidal ideas, hallucinations, memory loss and substance abuse. The patient has insomnia. The patient is not nervous/anxious.     Objective:  Physical  Exam  Constitutional: She is oriented to person, place, and time. She appears well-developed and well-nourished. No distress.  HENT:  Head: Normocephalic and atraumatic.  Right Ear: External ear normal.  Left Ear: External ear normal.  Nose: Nose normal.  Mouth/Throat: Oropharynx is clear and moist.  Eyes: Conjunctivae and EOM are normal.  Neck: Normal range of motion. Neck supple. No tracheal deviation present. No thyromegaly present.  Cardiovascular: Normal rate, regular rhythm, normal heart sounds and intact distal pulses.   No murmur heard. Respiratory: Effort normal and breath sounds normal. No respiratory distress. She has no wheezes. She exhibits no tenderness.  GI: Soft. Bowel sounds are normal. She exhibits no distension and no mass. There is no tenderness.  Musculoskeletal:       Right hip: Normal.       Left hip: Normal.       Right knee: Normal.       Left knee: She exhibits decreased range of motion, swelling and abnormal alignment. She exhibits no effusion and no erythema. Tenderness found. Medial joint line and lateral joint line tenderness noted.       Right lower leg: She exhibits no tenderness and no swelling.       Left lower leg: She exhibits no tenderness and no swelling.  Lymphadenopathy:    She has no cervical adenopathy.  Neurological: She is alert and oriented to person, place, and time. She has normal strength and normal reflexes. No sensory deficit.  Skin: No rash noted. She is not diaphoretic. No erythema.  Psychiatric: She has a normal mood and affect. Her behavior is normal.    Vitals Pulse: 66 (Regular) BP: 124/78 (Sitting, Left Arm, Standard)   Estimated body mass index is 30.81 kg/(m^2) as calculated from the following:   Height as of 12/30/11: 5\' 1"  (1.549 m).   Weight as of 08/30/12: 73.936 kg (163 lb).   Imaging Review Plain radiographs demonstrate severe degenerative joint disease of the left knee(s). The overall alignment ismild varus.  The bone quality appears to be good for age and reported activity level.  Assessment/Plan:  End stage arthritis, left knee   The patient history, physical examination, clinical judgment of the provider and imaging studies are consistent with end stage degenerative joint disease of the left knee(s) and total knee arthroplasty is deemed medically necessary. The treatment options including medical management, injection therapy arthroscopy and arthroplasty were discussed at length. The risks and benefits of total knee arthroplasty were presented and reviewed. The risks due to aseptic loosening, infection, stiffness, patella tracking problems, thromboembolic complications and other imponderables were discussed. The patient acknowledged the explanation, agreed to proceed with the plan and consent was signed. Patient is being admitted for inpatient treatment for surgery, pain control, PT, OT, prophylactic antibiotics, VTE prophylaxis, progressive ambulation and ADL's and discharge planning. The  patient is planning to be discharged home with home health services     [place], [place]

## 2013-02-16 ENCOUNTER — Inpatient Hospital Stay (HOSPITAL_COMMUNITY): Payer: Medicare Other | Admitting: Anesthesiology

## 2013-02-16 ENCOUNTER — Inpatient Hospital Stay (HOSPITAL_COMMUNITY): Payer: Medicare Other

## 2013-02-16 ENCOUNTER — Encounter (HOSPITAL_COMMUNITY)
Admission: RE | Disposition: A | Discharge status: Home Health | Payer: Self-pay | Source: Ambulatory Visit | Attending: Orthopedic Surgery

## 2013-02-16 ENCOUNTER — Encounter (HOSPITAL_COMMUNITY): Payer: Self-pay | Admitting: Anesthesiology

## 2013-02-16 ENCOUNTER — Encounter (HOSPITAL_COMMUNITY): Payer: Self-pay | Admitting: *Deleted

## 2013-02-16 ENCOUNTER — Inpatient Hospital Stay (HOSPITAL_COMMUNITY)
Admission: RE | Admit: 2013-02-16 | Discharge: 2013-02-18 | DRG: 470 | Disposition: A | Discharge status: Home Health | Payer: Medicare Other | Source: Ambulatory Visit | Attending: Orthopedic Surgery | Admitting: Orthopedic Surgery

## 2013-02-16 DIAGNOSIS — D62 Acute posthemorrhagic anemia: Secondary | ICD-10-CM | POA: Diagnosis not present

## 2013-02-16 DIAGNOSIS — M1712 Unilateral primary osteoarthritis, left knee: Secondary | ICD-10-CM | POA: Diagnosis present

## 2013-02-16 DIAGNOSIS — M1710 Unilateral primary osteoarthritis, unspecified knee: Principal | ICD-10-CM | POA: Diagnosis present

## 2013-02-16 DIAGNOSIS — M21869 Other specified acquired deformities of unspecified lower leg: Secondary | ICD-10-CM | POA: Diagnosis present

## 2013-02-16 DIAGNOSIS — M24569 Contracture, unspecified knee: Secondary | ICD-10-CM | POA: Diagnosis present

## 2013-02-16 HISTORY — PX: TOTAL KNEE ARTHROPLASTY: SHX125

## 2013-02-16 LAB — TYPE AND SCREEN
ABO/RH(D): B POS
Antibody Screen: NEGATIVE

## 2013-02-16 SURGERY — ARTHROPLASTY, KNEE, TOTAL
Anesthesia: Spinal | Site: Knee | Laterality: Left | Wound class: Clean

## 2013-02-16 MED ORDER — METHOCARBAMOL 100 MG/ML IJ SOLN
500.0000 mg | Freq: Four times a day (QID) | INTRAMUSCULAR | Status: DC | PRN
  Administered 2013-02-16: 500 mg via INTRAVENOUS
  Filled 2013-02-16 (×2): qty 5

## 2013-02-16 MED ORDER — FLEET ENEMA 7-19 GM/118ML RE ENEM: 1.0000 | ENEMA | Freq: Once | RECTAL | Status: AC | PRN

## 2013-02-16 MED ORDER — EPHEDRINE SULFATE 50 MG/ML IJ SOLN
INTRAMUSCULAR | Status: DC | PRN
  Administered 2013-02-16: 5 mg via INTRAVENOUS
  Administered 2013-02-16 (×2): 10 mg via INTRAVENOUS
  Administered 2013-02-16 (×4): 5 mg via INTRAVENOUS
  Administered 2013-02-16: 10 mg via INTRAVENOUS
  Administered 2013-02-16: 5 mg via INTRAVENOUS

## 2013-02-16 MED ORDER — HYDROMORPHONE HCL PF 1 MG/ML IJ SOLN: 0.2500 mg | INTRAMUSCULAR | Status: DC | PRN

## 2013-02-16 MED ORDER — MENTHOL 3 MG MT LOZG: 1.0000 | LOZENGE | OROMUCOSAL | Status: DC | PRN

## 2013-02-16 MED ORDER — DIPHENHYDRAMINE HCL 25 MG PO CAPS
25.0000 mg | ORAL_CAPSULE | Freq: Four times a day (QID) | ORAL | Status: DC | PRN
  Administered 2013-02-16 – 2013-02-17 (×2): 25 mg via ORAL
  Filled 2013-02-16 (×2): qty 1

## 2013-02-16 MED ORDER — MIDAZOLAM HCL 5 MG/5ML IJ SOLN
INTRAMUSCULAR | Status: DC | PRN
  Administered 2013-02-16: 1 mg via INTRAVENOUS
  Administered 2013-02-16: 0.5 mg via INTRAVENOUS

## 2013-02-16 MED ORDER — SODIUM CHLORIDE 0.9 % IR SOLN
Status: DC | PRN
  Administered 2013-02-16: 1000 mL

## 2013-02-16 MED ORDER — HYDROMORPHONE HCL PF 1 MG/ML IJ SOLN
1.0000 mg | INTRAMUSCULAR | Status: DC | PRN
  Administered 2013-02-16 – 2013-02-17 (×4): 1 mg via INTRAVENOUS
  Filled 2013-02-16 (×4): qty 1

## 2013-02-16 MED ORDER — ONDANSETRON HCL 4 MG/2ML IJ SOLN
INTRAMUSCULAR | Status: DC | PRN
  Administered 2013-02-16: 4 mg via INTRAVENOUS

## 2013-02-16 MED ORDER — ONDANSETRON HCL 4 MG/2ML IJ SOLN
4.0000 mg | Freq: Four times a day (QID) | INTRAMUSCULAR | Status: DC | PRN
  Administered 2013-02-16: 4 mg via INTRAVENOUS
  Filled 2013-02-16: qty 2

## 2013-02-16 MED ORDER — PROPOFOL 10 MG/ML IV EMUL
INTRAVENOUS | Status: DC | PRN
  Administered 2013-02-16: 75 ug/kg/min via INTRAVENOUS

## 2013-02-16 MED ORDER — FERROUS SULFATE 325 (65 FE) MG PO TABS
325.0000 mg | ORAL_TABLET | Freq: Three times a day (TID) | ORAL | Status: DC
  Administered 2013-02-17 – 2013-02-18 (×4): 325 mg via ORAL
  Filled 2013-02-16 (×8): qty 1

## 2013-02-16 MED ORDER — ATORVASTATIN CALCIUM 20 MG PO TABS
20.0000 mg | ORAL_TABLET | Freq: Every day | ORAL | Status: DC
  Administered 2013-02-17: 20 mg via ORAL
  Filled 2013-02-16 (×3): qty 1

## 2013-02-16 MED ORDER — ATENOLOL 50 MG PO TABS
50.0000 mg | ORAL_TABLET | Freq: Every morning | ORAL | Status: DC
  Administered 2013-02-17: 50 mg via ORAL
  Filled 2013-02-16 (×2): qty 1

## 2013-02-16 MED ORDER — ONDANSETRON HCL 4 MG PO TABS: 4.0000 mg | ORAL_TABLET | Freq: Four times a day (QID) | ORAL | Status: DC | PRN

## 2013-02-16 MED ORDER — PHENOL 1.4 % MT LIQD: 1.0000 | OROMUCOSAL | Status: DC | PRN

## 2013-02-16 MED ORDER — LACTATED RINGERS IV SOLN
INTRAVENOUS | Status: DC
  Administered 2013-02-16: 1000 mL via INTRAVENOUS
  Administered 2013-02-16: 14:00:00 via INTRAVENOUS

## 2013-02-16 MED ORDER — LISINOPRIL 20 MG PO TABS
20.0000 mg | ORAL_TABLET | Freq: Every day | ORAL | Status: DC
  Filled 2013-02-16 (×3): qty 1

## 2013-02-16 MED ORDER — QUINAPRIL HCL 10 MG PO TABS
20.0000 mg | ORAL_TABLET | Freq: Every day | ORAL | Status: DC
  Filled 2013-02-16: qty 2

## 2013-02-16 MED ORDER — LACTATED RINGERS IV SOLN
INTRAVENOUS | Status: DC
  Administered 2013-02-16: 18:00:00 via INTRAVENOUS

## 2013-02-16 MED ORDER — BUPIVACAINE LIPOSOME 1.3 % IJ SUSP
20.0000 mL | Freq: Once | INTRAMUSCULAR | Status: DC
  Filled 2013-02-16: qty 20

## 2013-02-16 MED ORDER — KETAMINE HCL 10 MG/ML IJ SOLN
INTRAMUSCULAR | Status: DC | PRN
  Administered 2013-02-16 (×3): 10 mg via INTRAVENOUS

## 2013-02-16 MED ORDER — TRIAMTERENE-HCTZ 37.5-25 MG PO TABS
1.0000 | ORAL_TABLET | Freq: Every morning | ORAL | Status: DC
  Administered 2013-02-17: 1 via ORAL
  Filled 2013-02-16 (×2): qty 1

## 2013-02-16 MED ORDER — PHENYLEPHRINE HCL 10 MG/ML IJ SOLN: INTRAMUSCULAR | Status: DC | PRN

## 2013-02-16 MED ORDER — THROMBIN 5000 UNITS EX SOLR
OROMUCOSAL | Status: DC | PRN
  Administered 2013-02-16: 14:00:00 via TOPICAL

## 2013-02-16 MED ORDER — BUPIVACAINE IN DEXTROSE 0.75-8.25 % IT SOLN
INTRATHECAL | Status: DC | PRN
  Administered 2013-02-16: 1.8 mL via INTRATHECAL

## 2013-02-16 MED ORDER — SODIUM CHLORIDE 0.9 % IR SOLN
Status: DC | PRN
  Administered 2013-02-16: 14:00:00

## 2013-02-16 MED ORDER — SODIUM CHLORIDE 0.9 % IJ SOLN
INTRAMUSCULAR | Status: DC | PRN
  Administered 2013-02-16: 16:00:00

## 2013-02-16 MED ORDER — ACETAMINOPHEN 650 MG RE SUPP: 650.0000 mg | Freq: Four times a day (QID) | RECTAL | Status: DC | PRN

## 2013-02-16 MED ORDER — CEFAZOLIN SODIUM 1-5 GM-% IV SOLN
1.0000 g | Freq: Four times a day (QID) | INTRAVENOUS | Status: AC
  Administered 2013-02-16 – 2013-02-17 (×2): 1 g via INTRAVENOUS
  Filled 2013-02-16 (×2): qty 50

## 2013-02-16 MED ORDER — PROMETHAZINE HCL 25 MG/ML IJ SOLN
6.2500 mg | INTRAMUSCULAR | Status: DC | PRN
  Administered 2013-02-16: 6.25 mg via INTRAVENOUS

## 2013-02-16 MED ORDER — METHOCARBAMOL 500 MG PO TABS
500.0000 mg | ORAL_TABLET | Freq: Four times a day (QID) | ORAL | Status: DC | PRN
  Administered 2013-02-17: 500 mg via ORAL
  Filled 2013-02-16: qty 1

## 2013-02-16 MED ORDER — BISACODYL 10 MG RE SUPP: 10.0000 mg | Freq: Every day | RECTAL | Status: DC | PRN

## 2013-02-16 MED ORDER — ALUM & MAG HYDROXIDE-SIMETH 200-200-20 MG/5ML PO SUSP: 30.0000 mL | ORAL | Status: DC | PRN

## 2013-02-16 MED ORDER — FENTANYL CITRATE 0.05 MG/ML IJ SOLN
INTRAMUSCULAR | Status: DC | PRN
  Administered 2013-02-16 (×2): 25 ug via INTRAVENOUS
  Administered 2013-02-16: 50 ug via INTRAVENOUS

## 2013-02-16 MED ORDER — ACETAMINOPHEN 325 MG PO TABS: 650.0000 mg | ORAL_TABLET | Freq: Four times a day (QID) | ORAL | Status: DC | PRN

## 2013-02-16 MED ORDER — POLYETHYLENE GLYCOL 3350 17 G PO PACK: 17.0000 g | PACK | Freq: Every day | ORAL | Status: DC | PRN

## 2013-02-16 MED ORDER — HYDROCODONE-ACETAMINOPHEN 5-325 MG PO TABS
1.0000 | ORAL_TABLET | ORAL | Status: DC | PRN
  Administered 2013-02-16: 1 via ORAL
  Administered 2013-02-17 – 2013-02-18 (×5): 2 via ORAL
  Filled 2013-02-16 (×3): qty 2
  Filled 2013-02-16: qty 1
  Filled 2013-02-16 (×2): qty 2

## 2013-02-16 MED ORDER — RIVAROXABAN 10 MG PO TABS
10.0000 mg | ORAL_TABLET | Freq: Every day | ORAL | Status: DC
  Administered 2013-02-17 – 2013-02-18 (×2): 10 mg via ORAL
  Filled 2013-02-16 (×3): qty 1

## 2013-02-16 MED ORDER — CELECOXIB 200 MG PO CAPS
200.0000 mg | ORAL_CAPSULE | Freq: Two times a day (BID) | ORAL | Status: DC
  Administered 2013-02-17 – 2013-02-18 (×2): 200 mg via ORAL
  Filled 2013-02-16 (×5): qty 1

## 2013-02-16 MED ORDER — CEFAZOLIN SODIUM-DEXTROSE 2-3 GM-% IV SOLR
2.0000 g | INTRAVENOUS | Status: AC
  Administered 2013-02-16: 2 g via INTRAVENOUS

## 2013-02-16 SURGICAL SUPPLY — 63 items
BAG SPEC THK2 15X12 ZIP CLS (MISCELLANEOUS)
BAG ZIPLOCK 12X15 (MISCELLANEOUS) ×1 IMPLANT
BANDAGE ELASTIC 4 VELCRO ST LF (GAUZE/BANDAGES/DRESSINGS) ×2 IMPLANT
BANDAGE ELASTIC 6 VELCRO ST LF (GAUZE/BANDAGES/DRESSINGS) ×2 IMPLANT
BANDAGE ESMARK 6X9 LF (GAUZE/BANDAGES/DRESSINGS) ×1 IMPLANT
BANDAGE GAUZE ELAST BULKY 4 IN (GAUZE/BANDAGES/DRESSINGS) ×2 IMPLANT
BLADE SAG 18X100X1.27 (BLADE) ×2 IMPLANT
BLADE SAW SGTL 11.0X1.19X90.0M (BLADE) ×2 IMPLANT
BNDG CMPR 9X6 STRL LF SNTH (GAUZE/BANDAGES/DRESSINGS) ×1
BNDG ESMARK 6X9 LF (GAUZE/BANDAGES/DRESSINGS) ×2
BONE CEMENT GENTAMICIN (Cement) ×4 IMPLANT
CEMENT BONE GENTAMICIN 40 (Cement) ×2 IMPLANT
CLOTH BEACON ORANGE TIMEOUT ST (SAFETY) ×2 IMPLANT
CUFF TOURN SGL QUICK 34 (TOURNIQUET CUFF) ×2
CUFF TRNQT CYL 34X4X40X1 (TOURNIQUET CUFF) ×1 IMPLANT
DRAPE EXTREMITY T 121X128X90 (DRAPE) ×2 IMPLANT
DRAPE INCISE IOBAN 66X45 STRL (DRAPES) ×1 IMPLANT
DRAPE LG THREE QUARTER DISP (DRAPES) ×2 IMPLANT
DRAPE POUCH INSTRU U-SHP 10X18 (DRAPES) ×2 IMPLANT
DRAPE U-SHAPE 47X51 STRL (DRAPES) ×2 IMPLANT
DRSG ADAPTIC 3X8 NADH LF (GAUZE/BANDAGES/DRESSINGS) ×2 IMPLANT
DRSG PAD ABDOMINAL 8X10 ST (GAUZE/BANDAGES/DRESSINGS) ×6 IMPLANT
DURAPREP 26ML APPLICATOR (WOUND CARE) ×2 IMPLANT
ELECT BLADE TIP CTD 4 INCH (ELECTRODE) ×1 IMPLANT
ELECT REM PT RETURN 9FT ADLT (ELECTROSURGICAL) ×2
ELECTRODE REM PT RTRN 9FT ADLT (ELECTROSURGICAL) ×1 IMPLANT
EVACUATOR 1/8 PVC DRAIN (DRAIN) ×2 IMPLANT
FACESHIELD LNG OPTICON STERILE (SAFETY) ×12 IMPLANT
GLOVE BIOGEL PI IND STRL 8 (GLOVE) ×1 IMPLANT
GLOVE BIOGEL PI INDICATOR 8 (GLOVE) ×1
GLOVE ECLIPSE 8.0 STRL XLNG CF (GLOVE) ×4 IMPLANT
GLOVE SURG SS PI 6.5 STRL IVOR (GLOVE) ×4 IMPLANT
GOWN PREVENTION PLUS LG XLONG (DISPOSABLE) ×3 IMPLANT
GOWN STRL REIN XL XLG (GOWN DISPOSABLE) ×3 IMPLANT
HANDPIECE INTERPULSE COAX TIP (DISPOSABLE) ×2
IMMOBILIZER KNEE 20 (SOFTGOODS) ×2
IMMOBILIZER KNEE 20 THIGH 36 (SOFTGOODS) IMPLANT
KIT BASIN OR (CUSTOM PROCEDURE TRAY) ×2 IMPLANT
MANIFOLD NEPTUNE II (INSTRUMENTS) ×2 IMPLANT
NEEDLE HYPO 22GX1.5 SAFETY (NEEDLE) ×2 IMPLANT
NS IRRIG 1000ML POUR BTL (IV SOLUTION) ×1 IMPLANT
PACK TOTAL JOINT (CUSTOM PROCEDURE TRAY) ×2 IMPLANT
POSITIONER SURGICAL ARM (MISCELLANEOUS) ×2 IMPLANT
SET HNDPC FAN SPRY TIP SCT (DISPOSABLE) ×1 IMPLANT
SPONGE GAUZE 4X4 12PLY (GAUZE/BANDAGES/DRESSINGS) ×1 IMPLANT
SPONGE LAP 18X18 X RAY DECT (DISPOSABLE) ×1 IMPLANT
SPONGE SURGIFOAM ABS GEL 100 (HEMOSTASIS) ×2 IMPLANT
STAPLER VISISTAT 35W (STAPLE) IMPLANT
STRIP CLOSURE SKIN 1/2X4 (GAUZE/BANDAGES/DRESSINGS) ×2 IMPLANT
SUCTION FRAZIER 12FR DISP (SUCTIONS) ×2 IMPLANT
SUT BONE WAX W31G (SUTURE) ×2 IMPLANT
SUT MNCRL AB 4-0 PS2 18 (SUTURE) ×2 IMPLANT
SUT VIC AB 1 CT1 27 (SUTURE) ×4
SUT VIC AB 1 CT1 27XBRD ANTBC (SUTURE) ×2 IMPLANT
SUT VIC AB 2-0 CT1 27 (SUTURE) ×6
SUT VIC AB 2-0 CT1 TAPERPNT 27 (SUTURE) ×2 IMPLANT
SUT VLOC 180 0 24IN GS25 (SUTURE) ×2 IMPLANT
SYR 20CC LL (SYRINGE) ×2 IMPLANT
TOWEL OR 17X26 10 PK STRL BLUE (TOWEL DISPOSABLE) ×4 IMPLANT
TOWER CARTRIDGE SMART MIX (DISPOSABLE) ×2 IMPLANT
TRAY FOLEY BAG SILVER LF 16FR (CATHETERS) ×1 IMPLANT
WATER STERILE IRR 1500ML POUR (IV SOLUTION) ×3 IMPLANT
WRAP KNEE MAXI GEL POST OP (GAUZE/BANDAGES/DRESSINGS) ×3 IMPLANT

## 2013-02-16 NOTE — Transfer of Care (Signed)
Immediate Anesthesia Transfer of Care Note  Patient: [name] Hamilton  Procedure(s) Performed: Procedure(s): LEFT TOTAL KNEE ARTHROPLASTY (Left)  Patient Location: PACU   Anesthesia Type:Regional  Level of Consciousness: awake, alert , oriented, sedated and patient cooperative  Airway & Oxygen Therapy: Patient Spontanous Breathing and Patient connected to face mask oxygen  Post-op Assessment: Report given to PACU RN and Post -op Vital signs reviewed and stable  Post vital signs: stable  Complications: No apparent anesthesia complications

## 2013-02-16 NOTE — Anesthesia Procedure Notes (Signed)
Spinal  Patient location during procedure: OR Staffing Performed by: anesthesiologist  Preanesthetic Checklist Completed: patient identified, site marked, surgical consent, pre-op evaluation, timeout performed, IV checked, risks and benefits discussed and monitors and equipment checked Spinal Block Patient position: sitting Prep: Betadine Patient monitoring: heart rate, continuous pulse ox and blood pressure Approach: left paramedian Location: L3-4 Injection technique: single-shot Needle Needle type: Spinocan  Needle gauge: 22 G Needle length: 9 cm Additional Notes Expiration date of kit checked and confirmed. Patient tolerated procedure well, without complications. No paresthesia. No blood in CSF.

## 2013-02-16 NOTE — Interval H&P Note (Signed)
History and Physical Interval Note:  02/16/2013 1:33 PM  [name] Hamilton  has presented today for surgery, with the diagnosis of osteoarthritis left knee   The various methods of treatment have been discussed with the patient and family. After consideration of risks, benefits and other options for treatment, the patient has consented to  Procedure(s): LEFT TOTAL KNEE ARTHROPLASTY (Left) as a surgical intervention .  The patient's history has been reviewed, patient examined, no change in status, stable for surgery.  I have reviewed the patient's chart and labs.  Questions were answered to the patient's satisfaction.     Kerstie Agent A

## 2013-02-16 NOTE — Brief Op Note (Signed)
02/16/2013  3:49 PM  PATIENT:  [name]  72 y.o. female  PRE-OPERATIVE DIAGNOSIS:  osteoarthritis left knee   POST-OPERATIVE DIAGNOSIS:  osteoarthritis left knee   PROCEDURE:  Procedure(s): LEFT TOTAL KNEE ARTHROPLASTY (Left)  SURGEON:  Surgeon(s) and Role:    * Jacki Cones, MD - Primary  PHYSICIAN ASSISTANT: Dimitri Ped PA  ASSISTANTS: Dimitri Ped PA  ANESTHESIA:   spinal  EBL:  Total I/O In: 1000 [I.V.:1000] Out: 150 [Urine:75; Blood:75]  BLOOD ADMINISTERED:none  DRAINS: (one) Hemovact drain(s) in the Left with  Suction Open   LOCAL MEDICATIONS USED:  BUPIVICAINE 20cc mixed with20cc Normal Saline  SPECIMEN:  No Specimen  DISPOSITION OF SPECIMEN:  N/A  COUNTS:  YES  TOURNIQUET:  * Missing tourniquet times found for documented tourniquets in log:  [id number] *  DICTATION: .Other Dictation: Dictation Number [id number]  PLAN OF CARE: Admit to inpatient   PATIENT DISPOSITION:  Stable in OR   Delay start of Pharmacological VTE agent (>24hrs) due to surgical blood loss or risk of bleeding: yes

## 2013-02-16 NOTE — Progress Notes (Signed)
Portable AP and Lateral left knee x-rays done.

## 2013-02-16 NOTE — Addendum Note (Signed)
Addendum created 02/16/13 1741 by Azell Der, MD   Modules edited: Anesthesia Review and Sign Navigator Section

## 2013-02-16 NOTE — Anesthesia Preprocedure Evaluation (Addendum)
Anesthesia Evaluation  Patient identified by MRN, date of birth, ID band Patient awake  General Assessment Comment:.  HTN (hypertension)     .  Hypercholesteremia     .  Anxiety     .  UTI (urinary tract infection)         hx of   .  Chest pain         Nuclear, 2005, normal   .  Vascular abnormality         Interventional radiology images raise the question of the beginning of possible an AVM it may be related to sensation that she hears in her ear   .  Ejection fraction         EF 55-60%, echo, September, 2013   .  Aortic valve sclerosis         Moderate calcification of the leaflets, echo, September, 2013,  no significant aortic stenosis.   .  Mitral regurgitation         Moderate annular calcification, echo, September, 2013, mild  regurgitation   .  Pneumonia  1967       hx of   .  GERD (gastroesophageal reflux disease)     .  Arthritis     .  Hearing loss of left ear       Reviewed: Allergy & Precautions, H&P , NPO status , Patient's Chart, lab work & pertinent test results  Airway Mallampati: II TM Distance: >3 FB Neck ROM: Full    Dental no notable dental hx.    Pulmonary pneumonia -,  breath sounds clear to auscultation  Pulmonary exam normal       Cardiovascular hypertension, Pt. on medications and Pt. on home beta blockers Rhythm:Regular Rate:Normal  ECG, CXR and Echo reviewed.   Neuro/Psych Anxiety negative neurological ROS     GI/Hepatic Neg liver ROS, GERD-  ,  Endo/Other  negative endocrine ROS  Renal/GU negative Renal ROS  negative genitourinary   Musculoskeletal negative musculoskeletal ROS (+)   Abdominal (+) + obese,   Peds negative pediatric ROS (+)  Hematology negative hematology ROS (+)   Anesthesia Other Findings   Reproductive/Obstetrics negative OB ROS                          Anesthesia Physical Anesthesia Plan  ASA: III  Anesthesia Plan: Spinal    Post-op Pain Management:    Induction: Intravenous  Airway Management Planned:   Additional Equipment:   Intra-op Plan:   Post-operative Plan: Extubation in OR  Informed Consent: I have reviewed the patients History and Physical, chart, labs and discussed the procedure including the risks, benefits and alternatives for the proposed anesthesia with the patient or authorized representative who has indicated his/her understanding and acceptance.   Dental advisory given  Plan Discussed with: CRNA  Anesthesia Plan Comments: (Discussed general versus spinal. Discussed risks/benefits of spinal including headache, backache, failure, bleeding, infection, and nerve damage. Patient consents to spinal. Questions answered. Coagulation studies and platelet count acceptable.)       Anesthesia Quick Evaluation

## 2013-02-16 NOTE — Progress Notes (Signed)
X-ray results noted 

## 2013-02-16 NOTE — Anesthesia Postprocedure Evaluation (Signed)
  Anesthesia Post-op Note  Patient: [name] Hamilton  Procedure(s) Performed: Procedure(s) (LRB): LEFT TOTAL KNEE ARTHROPLASTY (Left)  Patient Location: PACU  Anesthesia Type: Spinal  Level of Consciousness: awake and alert   Airway and Oxygen Therapy: Patient Spontanous Breathing  Post-op Pain: mild  Post-op Assessment: Post-op Vital signs reviewed, Patient's Cardiovascular Status Stable, Respiratory Function Stable, Patent Airway and No signs of Nausea or vomiting  Last Vitals:  Filed Vitals:   02/16/13 1730  BP: 124/60  Pulse: 56  Temp: 36.6 C  Resp: 18    Post-op Vital Signs: stable   Complications: No apparent anesthesia complications. Moving both legs.

## 2013-02-17 ENCOUNTER — Encounter (HOSPITAL_COMMUNITY): Payer: Self-pay | Admitting: Orthopedic Surgery

## 2013-02-17 DIAGNOSIS — D62 Acute posthemorrhagic anemia: Secondary | ICD-10-CM | POA: Diagnosis not present

## 2013-02-17 LAB — CBC
MCH: 29.9 pg (ref 26.0–34.0)
MCHC: 33.2 g/dL (ref 30.0–36.0)
MCV: 89.9 fL (ref 78.0–100.0)
Platelets: 148 10*3/uL — ABNORMAL LOW (ref 150–400)
RDW: 13.9 % (ref 11.5–15.5)
WBC: 6.6 10*3/uL (ref 4.0–10.5)

## 2013-02-17 LAB — BASIC METABOLIC PANEL
Calcium: 8.7 mg/dL (ref 8.4–10.5)
Creatinine, Ser: 0.75 mg/dL (ref 0.50–1.10)
GFR calc Af Amer: >90 mL/min (ref >90)
GFR calc non Af Amer: 84 mL/min — ABNORMAL LOW (ref >90)

## 2013-02-17 MED ORDER — RIVAROXABAN 10 MG PO TABS: 10.0000 mg | ORAL_TABLET | Freq: Every day | ORAL | Status: DC

## 2013-02-17 MED ORDER — METHOCARBAMOL 500 MG PO TABS: 500.0000 mg | ORAL_TABLET | Freq: Four times a day (QID) | ORAL | Status: DC | PRN

## 2013-02-17 MED ORDER — HYDROCODONE-ACETAMINOPHEN 10-325 MG PO TABS: 1.0000 | ORAL_TABLET | Freq: Four times a day (QID) | ORAL | Status: DC | PRN

## 2013-02-17 MED ORDER — FERROUS SULFATE 325 (65 FE) MG PO TABS: 325.0000 mg | ORAL_TABLET | Freq: Three times a day (TID) | ORAL | Status: DC

## 2013-02-17 MED FILL — Acetaminophen IV Soln 10 MG/ML: INTRAVENOUS | Qty: 100 | Status: AC

## 2013-02-17 NOTE — Progress Notes (Signed)
OT Cancellation Note  Patient Details Name: [name] Hamilton MRN: [id number] DOB: 1941/04/30   Cancelled Treatment:    Reason Eval/Treat Not Completed: Other (comment) (attempted OT but pt fatigued after PT session) will try back at a later time.  Lennox Laity [phone] 02/17/2013, 12:11 PM

## 2013-02-17 NOTE — Progress Notes (Signed)
Utilization review completed.  

## 2013-02-17 NOTE — Op Note (Signed)
NAMEBARB, SHEAR             ACCOUNT NO.:  [id number]  MEDICAL RECORD NO.:  [id number]  LOCATION:  1614                         FACILITY:  [facility]  PHYSICIAN:  Georges Lynch. Jeree Delcid, M.D.DATE OF BIRTH:  1941/09/18  DATE OF PROCEDURE:  02/16/2013 DATE OF DISCHARGE:                              OPERATIVE REPORT   SURGEON:  Georges Lynch. Darrelyn Hillock, M.D.  ASSISTANT:  Dimitri Ped, [place]  PREOPERATIVE DIAGNOSES: 1. Severe degenerative arthritis with a varus deformity of the left     knee. 2. Flexion contracture, left knee. 3. 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  PREOPERATIVE DIAGNOSES: 1. Severe degenerative arthritis with a varus deformity of the left     knee. 2. Flexion contracture, left knee. 3. 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  OPERATION:  Left total knee arthroplasty utilizing DePuy system.  I utilized a size 2 left femoral component, posterior cruciate sacrificing type.  The tibial component was a size 2, the insert was a size 2, 10-mm thickness rotating platform.  The patella was a 35 patella with 3 pegs. All three components were cemented in with antibiotic cement.  PROCEDURE:  Under spinal anesthesia, routine orthopedic prepping and draping of the left lower extremity was carried out.  The left leg was exsanguinated and Esmarch tourniquet was elevated to 325 mm after the appropriate time-out was carried out.  I also marked the appropriate left leg in the holding area.  Once the tourniquet was elevated, the knee then was flexed and we made a midline incision over the anterior aspect of the left knee.  She had 2 g of IV Ancef preop.  At this time, two flaps were created.  The patella was reflected laterally.  I then did medial and lateral meniscectomies.  I also excised the anterior and posterior cruciate ligaments.  At this time, I then made my initial drill hole in the intercondylar notch and at this point, removed 12-mm thickness off the distal femur.  At that  time, we then went on and measured our femur to be a size 2 left.  We carried out our anterior, posterior and chamfering cuts in the usual fashion with the appropriate jig.  After that, we then prepared the tibia in the same fashion with a size 2 for size 2 tray.  We removed approximately 8-mm thickness off the affected tibial side.  We then inserted our lamina spreaders to remove the posterior spurs and released the capsule and then inserted our spacer blocks, we had a nice reduction with our 10-mm spacer blocks.  We had good stability.  Spacer blocks were removed.  We then continued to prepare the tibia.  We cut our keel cut of the tibia in usual fashion. I also cut the distal femur and notch cut.  We then inserted our trial components and reduced the knee and had an excellent fit with the size 10-mm thickness insert.  Following that, we then prepared our patella. We did a resurfacing procedure on the patella with three drill holes were made in the patella for a size 35 patella.  We also did a synovectomy.  We thoroughly removed the components and thoroughly irrigated out the knee and cemented all three components in simultaneously.  After the cement was hardened, we did searched the loose pieces of cement.  I then injected a portion of the mixture of Exparel 20 mL plus 20 mL of normal saline.  Prior to that, we irrigated the knee out with Waterpik.  There were no other loose pieces of cement noted.  I then reduced the knee.  I took the range of motion and again had excellent function.  We then inserted some thrombin-soaked Gelfoam posteriorly.  I then went on and closed the knee in layers in usual fashion over Hemovac drain.  Sterile dressings were applied.          ______________________________ Georges Lynch Darrelyn Hillock, M.D.     RAG/MEDQ  D:  02/16/2013  T:  02/17/2013  Job:  [id number]

## 2013-02-17 NOTE — Progress Notes (Signed)
Physical Therapy Treatment Patient Details Name: [name] Hamilton MRN: [id number] DOB: [birth year] Today's Date: 02/17/2013 Time: 7829-5621 PT Time Calculation (min): 23 min  PT Assessment / Plan / Recommendation Comments on Treatment Session       Follow Up Recommendations  Home health PT     Does the patient have the potential to tolerate intense rehabilitation     Barriers to Discharge        Equipment Recommendations  None recommended by PT    Recommendations for Other Services OT consult  Frequency 7X/week   Plan Discharge plan remains appropriate    Precautions / Restrictions Precautions Precautions: Knee;Fall Required Braces or Orthoses: Knee Immobilizer - Left Knee Immobilizer - Left: On when out of bed or walking Restrictions Weight Bearing Restrictions: No Other Position/Activity Restrictions: WBAT   Pertinent Vitals/Pain 3/10; premedicated, ice packs provided    Mobility  Bed Mobility Bed Mobility: Sit to Supine Sit to Supine: 4: Min assist Details for Bed Mobility Assistance: cues for sequence and use of R LE to self assist Transfers Transfers: Sit to Stand;Stand to Sit Sit to Stand: 4: Min assist Stand to Sit: 4: Min assist Details for Transfer Assistance: cues for LE management and use of UEs to self assist Ambulation/Gait Ambulation/Gait Assistance: 4: Min assist Ambulation Distance (Feet): 91 Feet Assistive device: Rolling walker Ambulation/Gait Assistance Details: cues for sequence, posture, position from RW and stride length Gait Pattern: Step-to pattern;Step-through pattern;Decreased step length - left;Shuffle Stairs: No    Exercises     PT Diagnosis:    PT Problem List:   PT Treatment Interventions:     PT Goals Acute Rehab PT Goals PT Goal Formulation: With patient Time For Goal Achievement: 02/24/13 Potential to Achieve Goals: Good Pt will go Supine/Side to Sit: with supervision PT Goal: Supine/Side to Sit - Progress: Progressing  toward goal Pt will go Sit to Supine/Side: with supervision PT Goal: Sit to Supine/Side - Progress: Progressing toward goal Pt will go Sit to Stand: with supervision PT Goal: Sit to Stand - Progress: Progressing toward goal Pt will go Stand to Sit: with supervision PT Goal: Stand to Sit - Progress: Progressing toward goal Pt will Ambulate: 51 - 150 feet;with supervision;with rolling walker PT Goal: Ambulate - Progress: Progressing toward goal Pt will Go Up / Down Stairs: Flight;with min assist;with least restrictive assistive device PT Goal: Up/Down Stairs - Progress: Goal set today  Visit Information  Last PT Received On: 02/17/13 Assistance Needed: +1    Subjective Data  Subjective: I am doing so much better than this morning Patient Stated Goal: Resume previous lifestyle with decreased pain   Cognition  Cognition Arousal/Alertness: Awake/alert Behavior During Therapy: WFL for tasks assessed/performed Overall Cognitive Status: Within Functional Limits for tasks assessed    Balance     End of Session PT - End of Session Equipment Utilized During Treatment: Gait belt;Left knee immobilizer Activity Tolerance: Patient tolerated treatment well Patient left: with call bell/phone within reach;with family/visitor present;in bed Nurse Communication: Mobility status   GP     [name] Hamilton 02/17/2013, 4:56 PM

## 2013-02-17 NOTE — Progress Notes (Signed)
Physical Therapy Evaluation Patient Details Name: [name] Hamilton MRN: [id number] DOB: 09-02-1941 Today's Date: 02/17/2013 Time: 9528-4132 PT Time Calculation (min): 33 min  PT Assessment / Plan / Recommendation Clinical Impression  Pt s/p L TKR presents with decreased L LE strength/ROM and post op pain limiting functional mobility    PT Assessment  Patient needs continued PT services    Follow Up Recommendations  Home health PT    Does the patient have the potential to tolerate intense rehabilitation      Barriers to Discharge None      Equipment Recommendations  None recommended by PT    Recommendations for Other Services OT consult   Frequency 7X/week    Precautions / Restrictions Precautions Precautions: Knee;Fall Required Braces or Orthoses: Knee Immobilizer - Left Knee Immobilizer - Left: On when out of bed or walking Restrictions Weight Bearing Restrictions: No Other Position/Activity Restrictions: WBAT   Pertinent Vitals/Pain 5/10; premed, cold packs provided      Mobility  Bed Mobility Bed Mobility: Supine to Sit Supine to Sit: 4: Min assist;3: Mod assist Details for Bed Mobility Assistance: cues for sequence and use of R LE to self assist Transfers Transfers: Sit to Stand;Stand to Sit Sit to Stand: 4: Min assist;3: Mod assist Stand to Sit: 4: Min assist;3: Mod assist Details for Transfer Assistance: cues for LE management and use of UEs to self assist Ambulation/Gait Ambulation/Gait Assistance: 3: Mod assist Ambulation Distance (Feet): 15 Feet Assistive device: Rolling walker Ambulation/Gait Assistance Details: cues for posture, sequence, position from RW, stride length, and step to gait Gait Pattern: Step-to pattern;Step-through pattern;Decreased stance time - left;Shuffle;Trunk flexed;Antalgic Stairs: No    Exercises Total Joint Exercises Ankle Circles/Pumps: AROM;10 reps;Supine;Both Quad Sets: AROM;10 reps;Both;Supine Heel Slides: AAROM;10  reps;Supine;Left (++ muscle guarding) Straight Leg Raises: AAROM;Left;10 reps;Supine   PT Diagnosis: Difficulty walking  PT Problem List: Decreased strength;Decreased range of motion;Decreased activity tolerance;Decreased mobility;Decreased knowledge of use of DME;Pain;Decreased knowledge of precautions PT Treatment Interventions: DME instruction;Gait training;Stair training;Functional mobility training;Therapeutic activities;Therapeutic exercise;Patient/family education   PT Goals Acute Rehab PT Goals PT Goal Formulation: With patient Time For Goal Achievement: 02/24/13 Potential to Achieve Goals: Good Pt will go Supine/Side to Sit: with supervision PT Goal: Supine/Side to Sit - Progress: Goal set today Pt will go Sit to Supine/Side: with supervision PT Goal: Sit to Supine/Side - Progress: Goal set today Pt will go Sit to Stand: with supervision PT Goal: Sit to Stand - Progress: Goal set today Pt will go Stand to Sit: with supervision PT Goal: Stand to Sit - Progress: Goal set today Pt will Ambulate: 51 - 150 feet;with supervision;with rolling walker PT Goal: Ambulate - Progress: Goal set today Pt will Go Up / Down Stairs: Flight;with min assist;with least restrictive assistive device PT Goal: Up/Down Stairs - Progress: Goal set today  Visit Information  Last PT Received On: 02/17/13 Assistance Needed: +1    Subjective Data  Subjective: I wish therapy was not so hard Patient Stated Goal: Resume previous lifestyle with decreased pain   Prior Functioning  Home Living Lives With: Spouse Available Help at Discharge: Family Type of Home: House Home Access: Stairs to enter Secretary/administrator of Steps: 5 Entrance Stairs-Rails: Right Home Layout: Two level Alternate Level Stairs-Number of Steps: 14 Alternate Level Stairs-Rails: Right Home Adaptive Equipment: Walker - rolling Prior Function Level of Independence: Independent Able to Take Stairs?: Yes Driving:  Yes Vocation: Retired Musician: No difficulties Dominant Hand: Right    Cognition  Cognition Arousal/Alertness: Awake/alert Behavior During Therapy: WFL for tasks assessed/performed Overall Cognitive Status: Within Functional Limits for tasks assessed    Extremity/Trunk Assessment Right Upper Extremity Assessment RUE ROM/Strength/Tone: WFL for tasks assessed Left Upper Extremity Assessment LUE ROM/Strength/Tone: WFL for tasks assessed Right Lower Extremity Assessment RLE ROM/Strength/Tone: [facility] for tasks assessed Left Lower Extremity Assessment LLE ROM/Strength/Tone: Deficits LLE ROM/Strength/Tone Deficits: quads 2/5 with AAROm at knee -10 - 20 with ++ muscle guarding Trunk Assessment Trunk Assessment: Normal   Balance    End of Session PT - End of Session Equipment Utilized During Treatment: Gait belt;Left knee immobilizer Activity Tolerance: Patient tolerated treatment well Patient left: in chair;with call bell/phone within reach;with family/visitor present Nurse Communication: Mobility status  GP     Masaichi Kracht 02/17/2013, 1:26 PM

## 2013-02-17 NOTE — Progress Notes (Signed)
Subjective: 1 Day Post-Op Procedure(s) (LRB): LEFT TOTAL KNEE ARTHROPLASTY (Left) Patient reports pain as 4 on 0-10 scale.  Doing well today. Hbg 10.3. Hemovac Dcd. Will try to DC Tomorrow if Hbg Stable.  Objective: Vital signs in last 24 hours: Temp:  [97.3 F (36.3 C)-98.2 F (36.8 C)] 98.2 F (36.8 C) (05/23 0607) Pulse Rate:  [56-67] 67 (05/23 0607) Resp:  [13-20] 16 (05/23 0607) BP: (102-139)/(57-78) 102/57 mmHg (05/23 0607) SpO2:  [98 %-100 %] 99 % (05/23 0607) Weight:  [72.576 kg (160 lb)] 72.576 kg (160 lb) (05/22 1901)  Intake/Output from previous day: 05/22 0701 - 05/23 0700 In: 3290 [P.O.:240; I.V.:3050] Out: 1800 [Urine:1000; Drains:650; Blood:150] Intake/Output this shift:     Recent Labs  02/17/13 0431  HGB 10.3*    Recent Labs  02/17/13 0431  WBC 6.6  RBC 3.45*  HCT 31.0*  PLT 148*    Recent Labs  02/17/13 0431  NA 141  K 4.0  CL 105  CO2 29  BUN 14  CREATININE 0.75  GLUCOSE 154*  CALCIUM 8.7   No results found for this basename: LABPT, INR,  in the last 72 hours  Neurologically intact Dorsiflexion/Plantar flexion intact No cellulitis present  Assessment/Plan: 1 Day Post-Op Procedure(s) (LRB): LEFT TOTAL KNEE ARTHROPLASTY (Left) Up with therapy No Follow-up on file. Hbg tomorrow and get up wiyh PT.  [name] Hamilton A 02/17/2013, 7:43 AM

## 2013-02-17 NOTE — Care Management Note (Addendum)
    Page 1 of 2   02/18/2013     3:50:44 PM   CARE MANAGEMENT NOTE 02/18/2013  Patient:  Donahey,Caley   Account Number:  [id number]  Date Initiated:  02/17/2013  Documentation initiated by:  Colleen Can  Subjective/Objective Assessment:   dx Total knee replacemnt-left    Pre-arranged with Genevieve Norlander for [facility]     Action/Plan:   CM spoke with patient and family members. Plans are for patient to return to her home in [place] where her spouse will be primary caregivers. Children will assist as needed.   Anticipated DC Date:  02/18/2013   Anticipated DC Plan:  HOME W HOME HEALTH SERVICES      DC Planning Services  CM consult      [facility] Choice  HOME HEALTH   Choice offered to / List presented to:  C-1 Patient        HH arranged  HH-2 PT      [facility] agency  [facility]   Status of service:  Completed, signed off Medicare Important Message given?   (If response is "NO", the following Medicare IM given date fields will be blank) Date Medicare IM given:   Date Additional Medicare IM given:    Discharge Disposition:  HOME W HOME HEALTH SERVICES  Per UR Regulation:    If discussed at Long Length of Stay Meetings, dates discussed:    Comments:  02/18/13 Crisol Muecke RN,BSN NCM WEEKEND [phone] D/C HOME W/HHPT ORDERED,TC AMY-INTAKE TEL#1 [phone] [facility] TO CONFIRM D/C W/SOC 02/19/13.[facility] ALREADY HAD FINAL HH ORDERS.  02/17/2013 LINDA MANNING BSN RN CCM [phone] PT HAS RW AND 3N1. STATES HER SPOUSE WILL CHECK OUT HER RW AT HOME TO MAKE SURE IT IS THE RIGHT SIZE. SHE WILL LET CM KNOW IF SHE NEEDS ANOTHER RW. [facility] WILL PROVIDE HH SERVICES WITH START OF SERVICES DAY AFTER PT IS DISCHARGED.

## 2013-02-18 LAB — BASIC METABOLIC PANEL
BUN: 11 mg/dL (ref 6–23)
Calcium: 8.9 mg/dL (ref 8.4–10.5)
GFR calc non Af Amer: 83 mL/min — ABNORMAL LOW (ref >90)
Glucose, Bld: 125 mg/dL — ABNORMAL HIGH (ref 70–99)

## 2013-02-18 LAB — CBC
HCT: 28.2 % — ABNORMAL LOW (ref 36.0–46.0)
Hemoglobin: 9.1 g/dL — ABNORMAL LOW (ref 12.0–15.0)
MCH: 28.7 pg (ref 26.0–34.0)
MCHC: 32.3 g/dL (ref 30.0–36.0)

## 2013-02-18 MED ORDER — POTASSIUM CHLORIDE CRYS ER 20 MEQ PO TBCR
40.0000 meq | EXTENDED_RELEASE_TABLET | Freq: Two times a day (BID) | ORAL | Status: DC
  Administered 2013-02-18 (×2): 40 meq via ORAL
  Filled 2013-02-18 (×2): qty 2

## 2013-02-18 NOTE — Progress Notes (Signed)
   Subjective: 2 Days Post-Op Procedure(s) (LRB): LEFT TOTAL KNEE ARTHROPLASTY (Left)  Pt doing well Mild pain to left knee  Ready for d/c home Patient reports pain as mild.  Objective:   VITALS:   Filed Vitals:   02/18/13 0502  BP: 106/67  Pulse: 71  Temp: 98.2 F (36.8 C)  Resp: 16   Left knee incision healing well No erythema, no drainge Neurologically intact distally  LABS  Recent Labs  02/17/13 0431 02/18/13 0447  HGB 10.3* 9.1*  HCT 31.0* 28.2*  WBC 6.6 9.3  PLT 148* 150     Recent Labs  02/17/13 0431 02/18/13 0447  NA 141 137  K 4.0 3.1*  BUN 14 11  CREATININE 0.75 0.79  GLUCOSE 154* 125*     Assessment/Plan: 2 Days Post-Op Procedure(s) (LRB): LEFT TOTAL KNEE ARTHROPLASTY (Left) D/c home today F/u in 2 weeks  Discharge home with home health   Alphonsa Overall, MPAS, PA-C  02/18/2013, 7:40 AM

## 2013-02-18 NOTE — Discharge Summary (Signed)
Physician Discharge Summary   Patient ID: [name] Hamilton MRN: [id number] DOB/AGE: 1941-05-03 72 y.o.  Admit date: 02/16/2013 Discharge date: 02/18/2013  Admission Diagnoses:  Active Problems:   Osteoarthritis of left knee   Acute blood loss anemia   Discharge Diagnoses:  Same   Surgeries: Procedure(s): LEFT TOTAL KNEE ARTHROPLASTY on 02/16/2013   Consultants: PT/OT  Discharged Condition: Stable  Hospital Course: [name] Hamilton is an 72 y.o. female who was admitted 02/16/2013 with a chief complaint of No chief complaint on file. , and found to have a diagnosis of <principal problem not specified>.  They were brought to the operating room on 02/16/2013 and underwent the above named procedures.    The patient had an uncomplicated hospital course and was stable for discharge.  Recent vital signs:  Filed Vitals:   02/18/13 0502  BP: 106/67  Pulse: 71  Temp: 98.2 F (36.8 C)  Resp: 16    Recent laboratory studies:  Results for orders placed during the hospital encounter of 02/16/13  CBC      Result Value Range   WBC 6.6  4.0 - 10.5 K/uL   RBC 3.45 (*) 3.87 - 5.11 MIL/uL   Hemoglobin 10.3 (*) 12.0 - 15.0 g/dL   HCT 40.9 (*) 81.1 - 91.4 %   MCV 89.9  78.0 - 100.0 fL   MCH 29.9  26.0 - 34.0 pg   MCHC 33.2  30.0 - 36.0 g/dL   RDW 78.2  95.6 - 21.3 %   Platelets 148 (*) 150 - 400 K/uL  BASIC METABOLIC PANEL      Result Value Range   Sodium 141  135 - 145 mEq/L   Potassium 4.0  3.5 - 5.1 mEq/L   Chloride 105  96 - 112 mEq/L   CO2 29  19 - 32 mEq/L   Glucose, Bld 154 (*) 70 - 99 mg/dL   BUN 14  6 - 23 mg/dL   Creatinine, Ser 0.86  0.50 - 1.10 mg/dL   Calcium 8.7  8.4 - 57.8 mg/dL   GFR calc non Af Amer 84 (*) >90 mL/min   GFR calc Af Amer >90  >90 mL/min  CBC      Result Value Range   WBC 9.3  4.0 - 10.5 K/uL   RBC 3.17 (*) 3.87 - 5.11 MIL/uL   Hemoglobin 9.1 (*) 12.0 - 15.0 g/dL   HCT 46.9 (*) 62.9 - 52.8 %   MCV 89.0  78.0 - 100.0 fL   MCH 28.7  26.0 - 34.0  pg   MCHC 32.3  30.0 - 36.0 g/dL   RDW 41.3  24.4 - 01.0 %   Platelets 150  150 - 400 K/uL  BASIC METABOLIC PANEL      Result Value Range   Sodium 137  135 - 145 mEq/L   Potassium 3.1 (*) 3.5 - 5.1 mEq/L   Chloride 97  96 - 112 mEq/L   CO2 30  19 - 32 mEq/L   Glucose, Bld 125 (*) 70 - 99 mg/dL   BUN 11  6 - 23 mg/dL   Creatinine, Ser 2.72  0.50 - 1.10 mg/dL   Calcium 8.9  8.4 - 53.6 mg/dL   GFR calc non Af Amer 83 (*) >90 mL/min   GFR calc Af Amer >90  >90 mL/min    Discharge Medications:     Medication List    STOP taking these medications       aspirin 81 MG tablet  TAKE these medications       atenolol 50 MG tablet  Commonly known as:  TENORMIN  Take 50 mg by mouth every morning.     atorvastatin 20 MG tablet  Commonly known as:  LIPITOR  Take 20 mg by mouth at bedtime.     celecoxib 200 MG capsule  Commonly known as:  CELEBREX  Take 200 mg by mouth daily.     ferrous sulfate 325 (65 FE) MG tablet  Take 1 tablet (325 mg total) by mouth 3 (three) times daily after meals.     HYDROcodone-acetaminophen 10-325 MG per tablet  Commonly known as:  NORCO  Take 1 tablet by mouth every 6 (six) hours as needed for pain.     KRILL OIL PO  Take 1 capsule by mouth daily.     methocarbamol 500 MG tablet  Commonly known as:  ROBAXIN  Take 1 tablet (500 mg total) by mouth every 6 (six) hours as needed.     quinapril 20 MG tablet  Commonly known as:  ACCUPRIL  Take 20 mg by mouth at bedtime.     rivaroxaban 10 MG Tabs tablet  Commonly known as:  XARELTO  Take 1 tablet (10 mg total) by mouth daily with breakfast.     triamterene-hydrochlorothiazide 37.5-25 MG per tablet  Commonly known as:  MAXZIDE-25  Take 1 tablet by mouth every morning.     zolpidem 10 MG tablet  Commonly known as:  AMBIEN  Take 5 mg by mouth at bedtime as needed for sleep. prn        Diagnostic Studies: Dg Chest 2 View  02/08/2013   *RADIOLOGY REPORT*  Clinical Data: Preop left knee  replacement.  CHEST - 2 VIEW  Comparison: 02/25/2010.  Findings: Trachea is midline.  Heart size normal.  Mitral annulus calcification is noted.  Lungs are clear.  No pleural fluid.  IMPRESSION: No acute findings.   Original Report Authenticated By: Leanna Battles, M.D.   Dg Knee Left Port  02/16/2013   *RADIOLOGY REPORT*  Clinical Data: Post left total knee arthroplasty  PORTABLE LEFT KNEE - 1-2 VIEW  Comparison: Portable exam 1633 hours without priors for comparison  Findings: Components of left knee prosthesis identified in expected positions. No fracture, dislocation or periprosthetic lucency. Anterior surgical drain and expected postsurgical soft tissue changes. Diffuse osseous demineralization.  IMPRESSION: Left knee prosthesis without acute complication.   Original Report Authenticated By: Ulyses Southward, M.D.    Disposition:       Discharge Orders   Future Orders Complete By Expires     Call MD / Call 911  As directed     Comments:      If you experience chest pain or shortness of breath, CALL 911 and be transported to the hospital emergency room.  If you develope a fever above 101 F, pus (white drainage) or increased drainage or redness at the wound, or calf pain, call your surgeon's office.    Constipation Prevention  As directed     Comments:      Drink plenty of fluids.  Prune juice may be helpful.  You may use a stool softener, such as Colace (over the counter) 100 mg twice a day.  Use MiraLax (over the counter) for constipation as needed.    Diet - low sodium heart healthy  As directed     Discharge instructions  As directed     Comments:      Walk with your walker.  Weight bearing as instructed. Home Health Agency will follow you at home for your therapy and to manage your Therapy Change your dressing daily. Shower only, no tub bath. Call if any temperatures greater than 101 or any wound complications: [phone] during the day and ask for Dr. Jeannetta Ellis nurse, Mackey Birchwood.     Driving restrictions  As directed     Comments:      No driving for four weeks    Increase activity slowly as tolerated  As directed           Signed: Istvan Behar B 02/18/2013, 7:41 AM

## 2013-02-18 NOTE — Progress Notes (Signed)
OT Cancellation Note  Patient Details Name: [name] MRN: [id number] DOB: [birth year]   Cancelled Treatment:     Pt has had previous knee surgery 4 years ago.  She has husband to assist, DME and didn't feel she needed to review anything with OT.  Screen only.Marland Kitchen  Rhiannon Sassaman 02/18/2013, 10:45 AM Marica Otter, OTR/L [phone] 02/18/2013

## 2013-02-18 NOTE — Progress Notes (Signed)
Pt to d/c home with [facility]. No DME needs. AVS reviewed and "My Chart" discussed with pt. Pt capable of verbalizing medications and follow-up appointments. Remains hemodynamically stable. No signs and symptoms of distress. Educated pt to return to ER in the case of SOB, dizziness, or chest pain.

## 2013-02-18 NOTE — Progress Notes (Signed)
Physical Therapy Treatment Patient Details Name: [name] Hamilton MRN: [id number] DOB: Nov 30, 1940 Today's Date: 02/18/2013 Time: 1235-1300 PT Time Calculation (min): 25 min  PT Assessment / Plan / Recommendation Comments on Treatment Session  Husband assisting on stairs with written instruction provided.  Reviewed don/doff KI with pt and dtr    Follow Up Recommendations  Home health PT     Does the patient have the potential to tolerate intense rehabilitation     Barriers to Discharge        Equipment Recommendations  None recommended by PT    Recommendations for Other Services OT consult  Frequency 7X/week   Plan Discharge plan remains appropriate    Precautions / Restrictions Precautions Precautions: Knee;Fall Required Braces or Orthoses: Knee Immobilizer - Left Knee Immobilizer - Left: On when out of bed or walking;Discontinue once straight leg raise with < 10 degree lag Restrictions Weight Bearing Restrictions: No Other Position/Activity Restrictions: WBAT   Pertinent Vitals/Pain It does not hurt much    Mobility  Transfers Transfers: Sit to Stand;Stand to Sit Sit to Stand: 4: Min guard Stand to Sit: 5: Supervision Details for Transfer Assistance: min cues for UEs  Ambulation/Gait Ambulation/Gait Assistance: 4: Min guard Ambulation Distance (Feet): 60 Feet Assistive device: Rolling walker Ambulation/Gait Assistance Details: cues for position from RW, posture, R step length Gait Pattern: Step-to pattern;Trunk flexed Stairs: Yes Stairs Assistance: 4: Min assist Stairs Assistance Details (indicate cue type and reason): cues for sequence and crutch/foot placement Stair Management Technique: One rail Left;Forwards;With crutches Number of Stairs: 10    Exercises     PT Diagnosis:    PT Problem List:   PT Treatment Interventions:     PT Goals Acute Rehab PT Goals PT Goal Formulation: With patient Time For Goal Achievement: 02/24/13 Potential to Achieve  Goals: Good Pt will go Supine/Side to Sit: with supervision PT Goal: Supine/Side to Sit - Progress: Progressing toward goal Pt will go Sit to Supine/Side: with supervision PT Goal: Sit to Supine/Side - Progress: Progressing toward goal Pt will go Sit to Stand: with supervision PT Goal: Sit to Stand - Progress: Progressing toward goal Pt will go Stand to Sit: with supervision PT Goal: Stand to Sit - Progress: Progressing toward goal Pt will Ambulate: 51 - 150 feet;with supervision;with rolling walker PT Goal: Ambulate - Progress: Progressing toward goal Pt will Go Up / Down Stairs: Flight;with min assist;with least restrictive assistive device PT Goal: Up/Down Stairs - Progress: Met  Visit Information  Last PT Received On: 02/18/13 Assistance Needed: +1    Subjective Data  Subjective: That was not so bad on the stairs Patient Stated Goal: Resume previous lifestyle with decreased pain   Cognition  Cognition Arousal/Alertness: Awake/alert Behavior During Therapy: WFL for tasks assessed/performed Overall Cognitive Status: Within Functional Limits for tasks assessed    Balance     End of Session PT - End of Session Equipment Utilized During Treatment: Gait belt;Left knee immobilizer Activity Tolerance: Patient tolerated treatment well Patient left: with call bell/phone within reach;with family/visitor present;in bed Nurse Communication: Mobility status   GP     Abdel Effinger 02/18/2013, 1:15 PM

## 2013-02-18 NOTE — Progress Notes (Signed)
Physical Therapy Treatment Patient Details Name: [name] Hamilton MRN: [id number] DOB: 1941/08/26 Today's Date: 02/18/2013 Time: 4098-1191 PT Time Calculation (min): 49 min  PT Assessment / Plan / Recommendation Comments on Treatment Session       Follow Up Recommendations  Home health PT     Does the patient have the potential to tolerate intense rehabilitation     Barriers to Discharge        Equipment Recommendations  None recommended by PT    Recommendations for Other Services OT consult  Frequency 7X/week   Plan Discharge plan remains appropriate    Precautions / Restrictions Precautions Precautions: Knee;Fall Required Braces or Orthoses: Knee Immobilizer - Left Knee Immobilizer - Left: On when out of bed or walking;Discontinue once straight leg raise with < 10 degree lag Restrictions Weight Bearing Restrictions: No Other Position/Activity Restrictions: WBAT   Pertinent Vitals/Pain 5/10; premed, ice packs provided    Mobility  Bed Mobility Bed Mobility: Supine to Sit;Sit to Supine Supine to Sit: 4: Min guard Sit to Supine: 4: Min guard Details for Bed Mobility Assistance: cues for sequence and use of R LE to self assist Transfers Transfers: Sit to Stand;Stand to Sit Sit to Stand: 4: Min guard Stand to Sit: 4: Min guard Details for Transfer Assistance: cues for LE management and use of UEs to self assist Ambulation/Gait Ambulation/Gait Assistance: 4: Min guard Ambulation Distance (Feet): 200 Feet Assistive device: Rolling walker Ambulation/Gait Assistance Details: cues for stride length, sequence, position from RW Gait Pattern: Step-to pattern;Step-through pattern;Decreased step length - left;Shuffle    Exercises Total Joint Exercises Ankle Circles/Pumps: AROM;Supine;Both;20 reps Quad Sets: AROM;Both;Supine;20 reps Short Arc Quad: 10 reps;AAROM;AROM;Both;Seated Heel Slides: AAROM;Supine;Left;20 reps Straight Leg Raises: AAROM;Left;Supine;AROM;20  reps Goniometric ROM: -10 - 40   PT Diagnosis:    PT Problem List:   PT Treatment Interventions:     PT Goals Acute Rehab PT Goals PT Goal Formulation: With patient Time For Goal Achievement: 02/24/13 Potential to Achieve Goals: Good Pt will go Supine/Side to Sit: with supervision PT Goal: Supine/Side to Sit - Progress: Progressing toward goal Pt will go Sit to Supine/Side: with supervision PT Goal: Sit to Supine/Side - Progress: Progressing toward goal Pt will go Sit to Stand: with supervision PT Goal: Sit to Stand - Progress: Progressing toward goal Pt will go Stand to Sit: with supervision PT Goal: Stand to Sit - Progress: Progressing toward goal Pt will Ambulate: 51 - 150 feet;with supervision;with rolling walker PT Goal: Ambulate - Progress: Progressing toward goal Pt will Go Up / Down Stairs: Flight;with min assist;with least restrictive assistive device  Visit Information  Last PT Received On: 02/18/13 Assistance Needed: +1    Subjective Data  Subjective: I'm going home today Patient Stated Goal: Resume previous lifestyle with decreased pain   Cognition  Cognition Arousal/Alertness: Awake/alert Behavior During Therapy: WFL for tasks assessed/performed Overall Cognitive Status: Within Functional Limits for tasks assessed    Balance     End of Session PT - End of Session Equipment Utilized During Treatment: Gait belt Activity Tolerance: Patient tolerated treatment well Patient left: with call bell/phone within reach;with family/visitor present;in bed Nurse Communication: Mobility status   GP     [name] Hamilton 02/18/2013, 12:02 PM

## 2013-03-07 ENCOUNTER — Other Ambulatory Visit: Payer: Self-pay | Admitting: *Deleted

## 2013-03-07 MED ORDER — ATENOLOL 50 MG PO TABS: 50.0000 mg | ORAL_TABLET | Freq: Every morning | ORAL | Status: DC

## 2013-03-07 MED ORDER — TRIAMTERENE-HCTZ 37.5-25 MG PO TABS: 1.0000 | ORAL_TABLET | Freq: Every morning | ORAL | Status: DC

## 2013-03-07 MED ORDER — QUINAPRIL HCL 20 MG PO TABS: 20.0000 mg | ORAL_TABLET | Freq: Every day | ORAL | Status: DC

## 2013-03-08 ENCOUNTER — Telehealth: Payer: Self-pay | Admitting: Cardiology

## 2013-03-08 MED ORDER — QUINAPRIL HCL 20 MG PO TABS: 20.0000 mg | ORAL_TABLET | Freq: Every day | ORAL | Status: DC

## 2013-03-08 MED ORDER — TRIAMTERENE-HCTZ 37.5-25 MG PO TABS: 1.0000 | ORAL_TABLET | Freq: Every morning | ORAL | Status: DC

## 2013-03-08 MED ORDER — ATENOLOL 50 MG PO TABS: 50.0000 mg | ORAL_TABLET | Freq: Every morning | ORAL | Status: DC

## 2013-03-08 NOTE — Telephone Encounter (Signed)
New Problem:    Patient called in returning your call. Please call back. 

## 2013-08-18 ENCOUNTER — Telehealth: Payer: Self-pay | Admitting: Obstetrics & Gynecology

## 2013-08-18 NOTE — Telephone Encounter (Signed)
Patient calling for recent MMG results please?

## 2013-08-21 NOTE — Telephone Encounter (Signed)
Called patient back. She states that she got her results from [facility] and did not have any further questions.

## 2013-08-28 ENCOUNTER — Other Ambulatory Visit: Payer: Self-pay | Admitting: Cardiology

## 2013-09-01 ENCOUNTER — Ambulatory Visit: Payer: Self-pay | Admitting: Obstetrics & Gynecology

## 2013-09-08 ENCOUNTER — Ambulatory Visit: Payer: Medicare Other | Admitting: Cardiology

## 2013-09-15 ENCOUNTER — Encounter: Payer: Self-pay | Admitting: Obstetrics & Gynecology

## 2013-09-18 ENCOUNTER — Encounter: Payer: Self-pay | Admitting: Obstetrics & Gynecology

## 2013-09-18 ENCOUNTER — Ambulatory Visit (INDEPENDENT_AMBULATORY_CARE_PROVIDER_SITE_OTHER): Payer: Medicare Other | Admitting: Obstetrics & Gynecology

## 2013-09-18 VITALS — BP 126/84 | HR 68 | Resp 16 | Ht 59.25 in | Wt 154.2 lb

## 2013-09-18 DIAGNOSIS — Z124 Encounter for screening for malignant neoplasm of cervix: Secondary | ICD-10-CM

## 2013-09-18 DIAGNOSIS — Z01419 Encounter for gynecological examination (general) (routine) without abnormal findings: Secondary | ICD-10-CM

## 2013-09-18 NOTE — Progress Notes (Signed)
72 y.o. [name] Hamilton here for annual exam.  Good friend of Dr. Kenna Gilbert.  No vaginal bleeding.  Had left total knee in May.  Fractured pelvis after missing a step 03/22/13.  Still using a donut when she sits for extended periods of time.    PCP is Dr. Felipa Eth.  Sees him every six months.  Blood work was all good.    No LMP recorded. Patient is postmenopausal.          Sexually active: yes  The current method of family planning is post menopausal status.    Exercising: yes  some Smoker:  no  Health Maintenance: Pap:  4/12 normal History of abnormal Pap:  no MMG:  08/14/13 normal Colonoscopy:  with endoscopy 2013 repeat in 10 years with Dr Loreta Ave BMD:   6 years ago-normal scheduled for 2/15 TDaP:  2012 Screening Labs: PCP, Hb today: PCP, Urine today: PCP   reports that she has never smoked. She has never used smokeless tobacco. She reports that she drinks alcohol. She reports that she does not use illicit drugs.  Past Medical History  Diagnosis Date  . HTN (hypertension)   . Hypercholesteremia   . Anxiety     depression at times  . UTI (urinary tract infection)     hx of  . Chest pain     Nuclear, 2005, normal  . Vascular abnormality     Interventional radiology images raise the question of the beginning of possible an AVM it may be related to sensation that she hears in her ear  . Ejection fraction     EF 55-60%, echo, September, 2013  . Aortic valve sclerosis     Moderate calcification of the leaflets, echo, September, 2013,  no significant aortic stenosis.  . Mitral regurgitation     Moderate annular calcification, echo, September, 2013, mild  regurgitation  . Pneumonia 1967    hx of  . GERD (gastroesophageal reflux disease)   . Arthritis   . Hearing loss of left ear   . Anemia     with knee replacement  . Fractured coccyx     Past Surgical History  Procedure Laterality Date  . Cholecystectomy    . Lumbar spinal stenosis surgery without any complications    .  Cardiac catheterization    . Tendon repair Left 2013    in forarm  . Joint replacement Right 2010    knee  . 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 Tonsillectomy      Current Outpatient Prescriptions  Medication Sig Dispense Refill  . aspirin 81 MG tablet Take 81 mg by mouth daily.      Marland Kitchen atenolol (TENORMIN) 50 MG tablet Take 1 tablet (50 mg total) by mouth every morning.  90 tablet  1  . atorvastatin (LIPITOR) 20 MG tablet Take 20 mg by mouth at bedtime.      . Carisoprodol (SOMA PO) Take by mouth as needed.      . celecoxib (CELEBREX) 200 MG capsule Take 200 mg by mouth daily.      Marland Kitchen KRILL OIL PO Take 1 capsule by mouth daily. Mega red      . quinapril (ACCUPRIL) 20 MG tablet Take 1 tablet by mouth at  bedtime.  90 tablet  0  . triamterene-hydrochlorothiazide (MAXZIDE-25) 37.5-25 MG per tablet Take 1 tablet by  mouth every morning.  90 tablet  1  . zolpidem (AMBIEN) 10 MG tablet Take 5 mg by mouth at bedtime as needed for sleep. prn       No current facility-administered medications for this visit.    Family History  Problem Relation Age of Onset  . Heart attack Father   . Cancer Mother     ROS:  Pertinent items are noted in HPI.  Otherwise, a comprehensive ROS was negative.  Exam:   BP 126/84  Pulse 68  Resp 16  Ht 4' 11.25" (1.505 m)  Wt 154 lb 3.2 oz (69.945 kg)  BMI 30.88 kg/m2  Weight change: @WEIGHTCHANGE @ Height:   Height: 4' 11.25" (150.5 cm)  Ht Readings from Last 3 Encounters:  09/18/13 4' 11.25" (1.505 m)  02/16/13 5\' 1"  (1.549 m)  02/16/13 5\' 1"  (1.549 m)    General appearance: alert, cooperative and appears stated age Head: Normocephalic, without obvious abnormality, atraumatic Neck: no adenopathy, supple, symmetrical, trachea midline and thyroid normal to inspection and palpation Lungs: clear to auscultation  bilaterally Breasts: normal appearance, no masses or tenderness Heart: regular rate and rhythm Abdomen: soft, non-tender; bowel sounds normal; no masses,  no organomegaly Extremities: extremities normal, atraumatic, no cyanosis or edema Skin: Skin color, texture, turgor normal. No rashes or lesions Lymph nodes: Cervical, supraclavicular, and axillary nodes normal. No abnormal inguinal nodes palpated Neurologic: Grossly normal   Pelvic: External genitalia:  no lesions              Urethra:  normal appearing urethra with no masses, tenderness or lesions              Bartholins and Skenes: normal                 Vagina: normal appearing vagina with normal color and discharge, no lesions              Cervix: no lesions              Pap taken: yes Bimanual Exam:  Uterus:  normal size, contour, position, consistency, mobility, non-tender              Adnexa: normal adnexa and no mass, fullness, tenderness               Rectovaginal: Confirms               Anus:  normal sphincter tone, no lesions  A:  Well Woman with normal exam PMP, no vaginal bleeding Hypertension Hyperlipidemia  P:   Mammogram yearly.  D/W pt 3D MMG due to dense breasts. pap smear done today. Labs with Dr. Felipa Eth return annually or prn  An After Visit Summary was printed and given to the patient.

## 2013-09-18 NOTE — Patient Instructions (Signed)

## 2013-09-26 ENCOUNTER — Encounter: Payer: Self-pay | Admitting: Cardiology

## 2013-10-12 ENCOUNTER — Ambulatory Visit: Payer: Self-pay | Admitting: Obstetrics & Gynecology

## 2013-11-03 ENCOUNTER — Encounter: Payer: Self-pay | Admitting: Cardiology

## 2013-11-03 ENCOUNTER — Ambulatory Visit (INDEPENDENT_AMBULATORY_CARE_PROVIDER_SITE_OTHER): Payer: Medicare Other | Admitting: Cardiology

## 2013-11-03 ENCOUNTER — Encounter (INDEPENDENT_AMBULATORY_CARE_PROVIDER_SITE_OTHER): Payer: Self-pay

## 2013-11-03 VITALS — BP 132/78 | HR 75 | Ht 59.5 in | Wt 156.0 lb

## 2013-11-03 DIAGNOSIS — I10 Essential (primary) hypertension: Secondary | ICD-10-CM

## 2013-11-03 DIAGNOSIS — I059 Rheumatic mitral valve disease, unspecified: Secondary | ICD-10-CM

## 2013-11-03 DIAGNOSIS — E7800 Pure hypercholesterolemia, unspecified: Secondary | ICD-10-CM

## 2013-11-03 DIAGNOSIS — I340 Nonrheumatic mitral (valve) insufficiency: Secondary | ICD-10-CM

## 2013-11-03 MED ORDER — ATENOLOL 50 MG PO TABS: 50.0000 mg | ORAL_TABLET | Freq: Every morning | ORAL | Status: DC

## 2013-11-03 MED ORDER — QUINAPRIL HCL 20 MG PO TABS: 20.0000 mg | ORAL_TABLET | Freq: Every day | ORAL | Status: DC

## 2013-11-03 MED ORDER — TRIAMTERENE-HCTZ 37.5-25 MG PO TABS: 1.0000 | ORAL_TABLET | Freq: Every morning | ORAL | Status: DC

## 2013-11-03 NOTE — Assessment & Plan Note (Signed)
Blood pressure is controlled. No change in therapy. 

## 2013-11-03 NOTE — Patient Instructions (Signed)
Continue medication    Your physician wants you to follow-up in: 1 year. You will receive a reminder letter in the mail two months in advance. If you don't receive a letter, please call our office to schedule the follow-up appointment.

## 2013-11-03 NOTE — Assessment & Plan Note (Signed)
Her lipids are treated well by her primary physician.

## 2013-11-03 NOTE — Progress Notes (Signed)
HPI  Patient is seen to follow up her overall cardiac status. She does really quite well. She has very mild valvular disease. She had cardiac screening and there was question of some calcification. We decided to proceed with a full echo. This was done in 2013. She had moderate mitral annular calcification. There was no functional mitral stenosis. There was very mild mitral regurgitation. She had aortic valve sclerosis. There was no stenosis. There was no significant aortic insufficiency. I discussed these findings with her again. She does not need a followup echo at this time.  Allergies  Allergen Reactions  . Adhesive [Tape] Rash  . Latex Rash    Current Outpatient Prescriptions  Medication Sig Dispense Refill  . aspirin 81 MG tablet Take 81 mg by mouth daily.      Marland Kitchen atenolol (TENORMIN) 50 MG tablet Take 1 tablet (50 mg total) by mouth every morning.  90 tablet  1  . atorvastatin (LIPITOR) 20 MG tablet Take 20 mg by mouth at bedtime.      . Carisoprodol (SOMA PO) Take by mouth as needed.      . celecoxib (CELEBREX) 200 MG capsule Take 200 mg by mouth daily.      Marland Kitchen KRILL OIL PO Take 1 capsule by mouth daily. Mega red      . quinapril (ACCUPRIL) 20 MG tablet Take 1 tablet by mouth at  bedtime.  90 tablet  0  . triamterene-hydrochlorothiazide (MAXZIDE-25) 37.5-25 MG per tablet Take 1 tablet by mouth every morning.  90 tablet  1  . zolpidem (AMBIEN) 10 MG tablet Take 5 mg by mouth at bedtime as needed for sleep. prn       No current facility-administered medications for this visit.    History   Social History  . Marital Status: Married    Spouse Name: N/A    Number of Children: 2  . Years of Education: N/A   Occupational History  . Not on file.   Social History Main Topics  . Smoking status: Never Smoker   . Smokeless tobacco: Never Used  . Alcohol Use: Yes     Comment: socially-wine occ  . Drug Use: No  . Sexual Activity: Yes    Partners: Male   Other Topics Concern    . Not on file   Social History Narrative  . No narrative on file    Family History  Problem Relation Age of Onset  . Heart attack Father   . Cancer Mother     Past Medical History  Diagnosis Date  . HTN (hypertension)   . Hypercholesteremia   . Anxiety     depression at times  . UTI (urinary tract infection)     hx of  . Chest pain     Nuclear, 2005, normal  . Vascular abnormality     Interventional radiology images raise the question of the beginning of possible an AVM it may be related to sensation that she hears in her ear  . Ejection fraction     EF 55-60%, echo, September, 2013  . Aortic valve sclerosis     Moderate calcification of the leaflets, echo, September, 2013,  no significant aortic stenosis.  . Mitral regurgitation     Moderate annular calcification, echo, September, 2013, mild  regurgitation  . Pneumonia 1967    hx of  . GERD (gastroesophageal reflux disease)   . Arthritis   . Hearing loss of left ear   . Anemia  with knee replacement  . Fractured coccyx     Past Surgical History  Procedure Laterality Date  . Cholecystectomy    . Lumbar spinal stenosis surgery without any complications    . Cardiac catheterization    . Tendon repair Left 2013    in forarm  . Total knee arthroplasty Right 2010    Dr. Cay Schillings  . Total knee arthroplasty Left 02/16/2013    Procedure: LEFT TOTAL KNEE ARTHROPLASTY;  Surgeon: Tobi Bastos, MD;  Location: [facility];  Service: Orthopedics;  Laterality: Left;  . Tonsillectomy      Patient Active Problem List   Diagnosis Date Noted  . Acute blood loss anemia 02/17/2013  . Osteoarthritis of left knee 02/16/2013  . Ejection fraction   . Aortic valve sclerosis   . Mitral regurgitation   . Vascular abnormality   . Chest pain   . HYPERCHOLESTEROLEMIA 02/21/2010  . ANXIETY 02/21/2010  . HYPERTENSION 02/21/2010  . UTI'S, HX OF 02/21/2010    ROS   Patient denies fever, chills, headache, sweats, rash, change in  vision, change in hearing, chest pain, cough, nausea or vomiting, urinary symptoms. All other systems are reviewed and are negative.  PHYSICAL EXAM  Patient is stable. She is overweight. She is oriented to person time and place. Affect is normal. There is no jugulovenous distention. Lungs are clear. Respiratory effort is nonlabored. Cardiac exam reveals S1 and S2. There is a soft systolic murmur. The abdomen is soft. There is no peripheral edema.  Filed Vitals:   11/03/13 1525  BP: 132/78  Pulse: 75  Height: 4' 11.5" (1.511 m)  Weight: 156 lb (70.761 kg)  SpO2: 99%     ASSESSMENT & PLAN

## 2013-11-03 NOTE — Assessment & Plan Note (Signed)
There is mild mitral regurgitation by history. She does not need a followup echo. She is stable overall. No further workup.

## 2014-07-10 ENCOUNTER — Other Ambulatory Visit: Payer: Self-pay

## 2014-07-10 MED ORDER — ATORVASTATIN CALCIUM 20 MG PO TABS: 20.0000 mg | ORAL_TABLET | Freq: Every day | ORAL | Status: DC

## 2014-07-30 ENCOUNTER — Encounter: Payer: Self-pay | Admitting: Cardiology

## 2014-09-09 ENCOUNTER — Encounter: Payer: Self-pay | Admitting: Obstetrics & Gynecology

## 2014-11-02 ENCOUNTER — Encounter: Payer: Self-pay | Admitting: Obstetrics & Gynecology

## 2014-11-02 ENCOUNTER — Ambulatory Visit (INDEPENDENT_AMBULATORY_CARE_PROVIDER_SITE_OTHER): Payer: Medicare Other | Admitting: Obstetrics & Gynecology

## 2014-11-02 VITALS — BP 140/82 | HR 60 | Resp 16 | Ht 59.25 in | Wt 152.0 lb

## 2014-11-02 DIAGNOSIS — Z01419 Encounter for gynecological examination (general) (routine) without abnormal findings: Secondary | ICD-10-CM

## 2014-11-02 NOTE — Progress Notes (Signed)
74 y.o. [name] Hamilton here for annual exam.  Doing well.  Doing well since pelvic fracture.  No vaginal bleeding.    PCP:  Dr. Dagmar Hait.  Did bone density and lab work.  Lab work is done every six months.    Has appt with Dr. Ron Parker next week.  Last echo was 9/13.    No LMP recorded. Patient is postmenopausal.          Sexually active: Yes.    The current method of family planning is post menopausal status.    Exercising: Yes.    sometimes Smoker:  no  Health Maintenance: Pap:  09/18/13 WNL History of abnormal Pap:  no MMG:  08/14/13-normal Colonoscopy:  2012-repeat in 10 years Dr Collene Mares BMD:   12/15 with Dr Dagmar Hait TDaP:  2015 Screening Labs: PCP, Hb today: PCP, Urine today: PCP   reports that she has never smoked. She has never used smokeless tobacco. She reports that she drinks alcohol. She reports that she does not use illicit drugs.  Past Medical History  Diagnosis Date  . HTN (hypertension)   . Hypercholesteremia   . Anxiety     depression at times  . UTI (urinary tract infection)     hx of  . Chest pain     Nuclear, 2005, normal  . AVM (arteriovenous malformation)        . Ejection fraction     EF 55-60%, echo, September, 2013  . Aortic valve sclerosis     Moderate calcification of the leaflets, echo, September, 2013,  no significant aortic stenosis.  . Mitral regurgitation     Moderate annular calcification, echo, September, 2013, mild  regurgitation  . Pneumonia 1967    hx of  . GERD (gastroesophageal reflux disease)   . Arthritis   . Hearing loss of left ear   . Anemia     with knee replacement  . Fractured coccyx     Past Surgical History  Procedure Laterality Date  . Cholecystectomy    . Lumbar spinal stenosis surgery without any complications    . Cardiac catheterization    . Tendon repair Left 2013    in forarm  . Total knee arthroplasty Right 2010    Dr. Cay Schillings  . Total knee arthroplasty Left 02/16/2013    Procedure: LEFT TOTAL KNEE ARTHROPLASTY;   Surgeon: Tobi Bastos, MD;  Location: [facility];  Service: Orthopedics;  Laterality: Left;  . Tonsillectomy      Current Outpatient Prescriptions  Medication Sig Dispense Refill  . aspirin 81 MG tablet Take 81 mg by mouth daily.    Marland Kitchen atenolol (TENORMIN) 50 MG tablet Take 1 tablet (50 mg total) by mouth every morning. 90 tablet 3  . atorvastatin (LIPITOR) 20 MG tablet Take 1 tablet (20 mg total) by mouth at bedtime. 90 tablet 2  . Carisoprodol (SOMA PO) Take by mouth as needed.    . celecoxib (CELEBREX) 200 MG capsule Take 200 mg by mouth daily.    Marland Kitchen KRILL OIL PO Take 1 capsule by mouth daily. Mega red    . quinapril (ACCUPRIL) 20 MG tablet Take 1 tablet (20 mg total) by mouth daily. 90 tablet 3  . triamterene-hydrochlorothiazide (MAXZIDE-25) 37.5-25 MG per tablet Take 1 tablet by mouth every morning. 90 tablet 3  . zolpidem (AMBIEN) 10 MG tablet Take 5 mg by mouth at bedtime as needed for sleep. prn     No current facility-administered medications for this visit.  Family History  Problem Relation Age of Onset  . Heart attack Father   . Cancer Mother     ROS:  Pertinent items are noted in HPI.  Otherwise, a comprehensive ROS was negative.  Exam:   BP 140/82 mmHg  Pulse 60  Resp 16  Ht 4' 11.25" (1.505 m)  Wt 152 lb (68.947 kg)  BMI 30.44 kg/m2  LMP    Height: 4' 11.25" (150.5 cm)  Ht Readings from Last 3 Encounters:  11/02/14 4' 11.25" (1.505 m)  11/03/13 4' 11.5" (1.511 m)  09/18/13 4' 11.25" (1.505 m)    General appearance: alert, cooperative and appears stated age Head: Normocephalic, without obvious abnormality, atraumatic Neck: no adenopathy, supple, symmetrical, trachea midline and thyroid normal to inspection and palpation Lungs: clear to auscultation bilaterally Breasts: normal appearance, no masses or tenderness Heart: regular rate and rhythm Abdomen: soft, non-tender; bowel sounds normal; no masses,  no organomegaly Extremities: extremities normal,  atraumatic, no cyanosis or edema Skin: Skin color, texture, turgor normal. No rashes or lesions Lymph nodes: Cervical, supraclavicular, and axillary nodes normal. No abnormal inguinal nodes palpated Neurologic: Grossly normal   Pelvic: External genitalia:  no lesions              Urethra:  normal appearing urethra with no masses, tenderness or lesions              Bartholins and Skenes: normal                 Vagina: normal appearing vagina with normal color and discharge, no lesions              Cervix: no lesions              Pap taken: No. Bimanual Exam:  Uterus:  normal size, contour, position, consistency, mobility, non-tender              Adnexa: normal adnexa and no mass, fullness, tenderness               Rectovaginal: Confirms               Anus:  normal sphincter tone, no lesions  Chaperone was present for exam.  A:  Well Woman with normal exam PMP, no HRT Hypertension Hyperlipidemia  P: Mammogram yearly. D/W pt 3D MMG due to dense breasts.  Pt knows it is due and she states she will call and schedule it. pap smear done  12/14.  No pap today. Labs with Dr. Dagmar Hait every six months. return annually or prn

## 2014-11-05 ENCOUNTER — Encounter: Payer: Self-pay | Admitting: Cardiology

## 2014-11-05 ENCOUNTER — Ambulatory Visit (INDEPENDENT_AMBULATORY_CARE_PROVIDER_SITE_OTHER): Payer: Medicare Other | Admitting: Cardiology

## 2014-11-05 VITALS — BP 144/80 | HR 64 | Ht 59.25 in | Wt 156.4 lb

## 2014-11-05 DIAGNOSIS — I358 Other nonrheumatic aortic valve disorders: Secondary | ICD-10-CM

## 2014-11-05 DIAGNOSIS — I10 Essential (primary) hypertension: Secondary | ICD-10-CM

## 2014-11-05 DIAGNOSIS — E7800 Pure hypercholesterolemia, unspecified: Secondary | ICD-10-CM

## 2014-11-05 DIAGNOSIS — I159 Secondary hypertension, unspecified: Secondary | ICD-10-CM

## 2014-11-05 DIAGNOSIS — I340 Nonrheumatic mitral (valve) insufficiency: Secondary | ICD-10-CM

## 2014-11-05 MED ORDER — ATORVASTATIN CALCIUM 20 MG PO TABS: 20.0000 mg | ORAL_TABLET | Freq: Every day | ORAL | Status: AC

## 2014-11-05 MED ORDER — QUINAPRIL HCL 20 MG PO TABS: 20.0000 mg | ORAL_TABLET | Freq: Every day | ORAL | Status: DC

## 2014-11-05 MED ORDER — ATENOLOL 50 MG PO TABS: 50.0000 mg | ORAL_TABLET | Freq: Every morning | ORAL | Status: DC

## 2014-11-05 MED ORDER — TRIAMTERENE-HCTZ 37.5-25 MG PO TABS: 1.0000 | ORAL_TABLET | Freq: Every morning | ORAL | Status: DC

## 2014-11-05 NOTE — Assessment & Plan Note (Signed)
She does have calcification of her aortic valve leaflets with sclerosis. There is no definite stenosis in 2013. She does not need a follow-up echo now. Follow-up can be considered over time.

## 2014-11-05 NOTE — Assessment & Plan Note (Signed)
She had trivial mitral regurgitation with moderate annular calcification by echo in 2013. There was no functional stenosis. Follow-up echo can be done in the future.

## 2014-11-05 NOTE — Patient Instructions (Signed)
Your physician recommends that you continue on your current medications as directed. Please refer to the Current Medication list given to you today.  Your physician wants you to follow-up in: 1 year. You will receive a reminder letter in the mail two months in advance. If you don't receive a letter, please call our office to schedule the follow-up appointment.  

## 2014-11-05 NOTE — Assessment & Plan Note (Signed)
Lipids are being treated by her primary physician.

## 2014-11-05 NOTE — Progress Notes (Signed)
Cardiology Office Note   Date:  11/05/2014   ID:  [name] Hamilton, DOB 01-02-1941, MRN [id number]  PCP:  [name] Ringer, MD  Cardiologist:  [name] Argyle, MD   Chief Complaint  Patient presents with  . Appointment    Follow-up valvular heart disease      History of Present Illness: [name] Hamilton is a 74 y.o. female who presents for a yearly follow-up of mild valvular heart disease. I've known this patient since the 67s. She does not have significant disease. Several years ago she had some annular calcification noted on cardiac screening. She had a full 2-D echo. It showed mild annular calcification. There was no functional mitral stenosis. There was mild mitral regurgitation. There was aortic valve sclerosis with no stenosis. There was no significant aortic insufficiency. The study was done in 2013. She has not required a follow-up study since then. She follows carefully with Dr. Dagmar Hait, for primary care. He doesn't excellent job with her overall care.    Past Medical History  Diagnosis Date  . HTN (hypertension)   . Hypercholesteremia   . Anxiety     depression at times  . UTI (urinary tract infection)     hx of  . Chest pain     Nuclear, 2005, normal  . AVM (arteriovenous malformation)        . Ejection fraction     EF 55-60%, echo, September, 2013  . Aortic valve sclerosis     Moderate calcification of the leaflets, echo, September, 2013,  no significant aortic stenosis.  . Mitral regurgitation     Moderate annular calcification, echo, September, 2013, mild  regurgitation  . Pneumonia 1967    hx of  . GERD (gastroesophageal reflux disease)   . Arthritis   . Hearing loss of left ear   . Anemia     with knee replacement  . Fractured coccyx     Past Surgical History  Procedure Laterality Date  . Cholecystectomy    . Lumbar spinal stenosis surgery without any complications    . Cardiac catheterization    . Tendon repair Left 2013    in forarm  . Total knee  arthroplasty Right 2010    Dr. Cay Schillings  . Total knee arthroplasty Left 02/16/2013    Procedure: LEFT TOTAL KNEE ARTHROPLASTY;  Surgeon: Tobi Bastos, MD;  Location: [facility];  Service: Orthopedics;  Laterality: Left;  . Tonsillectomy      Patient Active Problem List   Diagnosis Date Noted  . Acute blood loss anemia 02/17/2013  . Osteoarthritis of left knee 02/16/2013  . Ejection fraction   . Aortic valve sclerosis   . Mitral regurgitation   . Vascular abnormality   . Chest pain   . HYPERCHOLESTEROLEMIA 02/21/2010  . ANXIETY 02/21/2010  . HYPERTENSION 02/21/2010  . UTI'S, HX OF 02/21/2010      Current Outpatient Prescriptions  Medication Sig Dispense Refill  . aspirin 81 MG tablet Take 81 mg by mouth daily.    Marland Kitchen atenolol (TENORMIN) 50 MG tablet Take 1 tablet (50 mg total) by mouth every morning. 90 tablet 3  . atorvastatin (LIPITOR) 20 MG tablet Take 1 tablet (20 mg total) by mouth at bedtime. 90 tablet 2  . celecoxib (CELEBREX) 200 MG capsule Take 200 mg by mouth daily.    Marland Kitchen KRILL OIL PO Take 1 capsule by mouth daily. Mega red    . quinapril (ACCUPRIL) 20 MG tablet Take 1 tablet (20 mg total) by mouth  daily. 90 tablet 3  . triamterene-hydrochlorothiazide (MAXZIDE-25) 37.5-25 MG per tablet Take 1 tablet by mouth every morning. 90 tablet 3  . zolpidem (AMBIEN) 10 MG tablet Take 5 mg by mouth at bedtime as needed for sleep. prn     No current facility-administered medications for this visit.    Allergies:   Adhesive and Latex    Social History:  The patient  reports that she has never smoked. She has never used smokeless tobacco. She reports that she drinks alcohol. She reports that she does not use illicit drugs.   Family History:  The patient's  family history includes Cancer in her mother; Heart attack in her father.    ROS:  Please see the history of present illness.   Patient denies fever, chills, headache, sweats, rash, change in vision, change in hearing, chest  pain, cough, nausea or vomiting, urinary symptoms. All other systems are reviewed and are negative.     PHYSICAL EXAM: VS:  BP 144/80 mmHg  Pulse 64  Ht 4' 11.25" (1.505 m)  Wt 156 lb 6.4 oz (70.943 kg)  BMI 31.32 kg/m2 , Patient looks quite good. She is oriented to person time and place. Affect is normal. Head is atraumatic. Sclera and conjunctiva are normal. There is no jugulovenous distention. Lungs are clear. Respiratory effort is not labored. Cardiac exam reveals S1 and S2. The abdomen is soft. There is no peripheral edema. There are no musculoskeletal deformities. There are no skin rashes.  EKG:   EKG is done today and reviewed by me. There is no significant abnormality. There is normal sinus rhythm. There is no change from the past.  Recent Labs: No results found for requested labs within last 365 days.    Lipid Panel No results found for: CHOL, TRIG, HDL, CHOLHDL, VLDL, LDLCALC, LDLDIRECT    Wt Readings from Last 3 Encounters:  11/05/14 156 lb 6.4 oz (70.943 kg)  11/02/14 152 lb (68.947 kg)  11/03/13 156 lb (70.761 kg)      Current medicines are reviewed. She understands her medicines completely.    ASSESSMENT AND PLAN:

## 2015-09-03 ENCOUNTER — Telehealth: Payer: Self-pay | Admitting: *Deleted

## 2015-09-03 DIAGNOSIS — R102 Pelvic and perineal pain: Secondary | ICD-10-CM

## 2015-09-03 NOTE — Telephone Encounter (Signed)
Reviewed with Dr Sabra Heck. Patient has seen Dr Collene Mares for lower abdomen/pelvic pain. Per Dr Sabra Heck, Office visit with pelvic ultrasound recommended. Appointment scheduled for 09-05-15 at 130. Call to patient to advise. No answer and no voice mail.

## 2015-09-04 NOTE — Telephone Encounter (Signed)
Call to patient, notified of appointment for pelvic ultrasound and patient agreeable.   Routing to provider for final review. Patient agreeable to disposition. Will close encounter.

## 2015-09-05 ENCOUNTER — Ambulatory Visit (INDEPENDENT_AMBULATORY_CARE_PROVIDER_SITE_OTHER): Payer: Medicare Other

## 2015-09-05 ENCOUNTER — Ambulatory Visit (INDEPENDENT_AMBULATORY_CARE_PROVIDER_SITE_OTHER): Payer: Medicare Other | Admitting: Obstetrics & Gynecology

## 2015-09-05 VITALS — BP 122/86 | HR 68 | Resp 16 | Ht 59.25 in | Wt 156.0 lb

## 2015-09-05 DIAGNOSIS — R140 Abdominal distension (gaseous): Secondary | ICD-10-CM

## 2015-09-05 DIAGNOSIS — R102 Pelvic and perineal pain: Secondary | ICD-10-CM

## 2015-09-05 DIAGNOSIS — D250 Submucous leiomyoma of uterus: Secondary | ICD-10-CM

## 2015-09-05 NOTE — Progress Notes (Signed)
74 y.o. G3P2 Married [place] female here for pelvic ultrasound due to episode of abdominal bloating and abdominal/pelvic pain that has lasted for months.  Pt has been diagnosed with IBS and had siminlar episode a couple of years ago.  That one lasted almost six months.  Pt isn't really sure what the trigger was for this episode.  She has been evaluated by Dr. Collene Mares, GI, who also wanted her to be evaluated with a pelvic ultrasound.  Pt reports in the last couple of days, her distension and discomfort have finally started to go down.  She doesn't feel "pregnant" anymore.  She denies vaginal bleeding or any changes to her urinary habits.  No LMP recorded. Patient is postmenopausal.  Contraception: PMP  Findings:  UTERUS: 6.6 x 4.5 x 3.3cm with 1.8cm probable submucosal fibroid EMS: 3.71mm with thin sliver of fluid noted ADNEXA: Left ovary: 1.8 x 0.7 x 1.0cm       Right ovary: 1.2 x 0.7 x 0.8cm CUL DE SAC: no free fluid  Reviewed findings with pt.  Uterus is not enlarged on physical exam.  Ultrasound ordered by Dr. Collene Mares 9/12 did not show any fibroids nor does a CT from 8/12.  Also, CT form 6/12 does not mention a fibroid either.  Upon reviewing this with her, pt states she was told by prior gynecologist (Dr. Thompson Caul) that he could feel a fibroid and pt reports she was told this for "years".  Pt has AEX scheduled in April.  Will plan to repeat PUS at six months just to document this fibroid is stable.  Pt also aware to call if she has any vaginal bleeding.  As endometrium is thin around sliver of fluid, do not feel biopsy is warranted.    Assessment:  Abdominal bloating and pain which is improved over the past few days Submucosal fibroid Sliver of endometrial fluid  Plan:  Reasons to call and/or come back reviewed Pt will return for AEX in April Will plan six month follow up ultrasound just to ensure stability of fibroid as was not noted on three prior pelvic imaging studies in the past.    ~15  minutes spent with patient >50% of time was in face to face discussion of above.

## 2015-09-11 ENCOUNTER — Encounter: Payer: Self-pay | Admitting: Obstetrics & Gynecology

## 2015-10-30 ENCOUNTER — Other Ambulatory Visit: Payer: Self-pay | Admitting: Cardiology

## 2015-11-21 ENCOUNTER — Other Ambulatory Visit: Payer: Self-pay | Admitting: Gastroenterology

## 2015-11-21 DIAGNOSIS — R1084 Generalized abdominal pain: Secondary | ICD-10-CM

## 2015-11-26 ENCOUNTER — Ambulatory Visit
Admission: RE | Admit: 2015-11-26 | Discharge: 2015-11-26 | Disposition: A | Discharge status: Home / Self Care | Payer: Medicare Other | Source: Ambulatory Visit | Attending: Gastroenterology | Admitting: Gastroenterology

## 2015-11-26 DIAGNOSIS — R1084 Generalized abdominal pain: Secondary | ICD-10-CM

## 2015-11-26 MED ORDER — IOPAMIDOL (ISOVUE-300) INJECTION 61%: 100.0000 mL | Freq: Once | INTRAVENOUS | Status: DC | PRN

## 2016-01-03 ENCOUNTER — Ambulatory Visit (INDEPENDENT_AMBULATORY_CARE_PROVIDER_SITE_OTHER): Payer: Medicare Other | Admitting: Obstetrics & Gynecology

## 2016-01-03 ENCOUNTER — Encounter: Payer: Self-pay | Admitting: Obstetrics & Gynecology

## 2016-01-03 VITALS — BP 118/60 | HR 72 | Resp 18 | Ht 59.0 in | Wt 155.0 lb

## 2016-01-03 DIAGNOSIS — Z124 Encounter for screening for malignant neoplasm of cervix: Secondary | ICD-10-CM | POA: Diagnosis not present

## 2016-01-03 DIAGNOSIS — Z01419 Encounter for gynecological examination (general) (routine) without abnormal findings: Secondary | ICD-10-CM

## 2016-01-03 DIAGNOSIS — N9089 Other specified noninflammatory disorders of vulva and perineum: Secondary | ICD-10-CM | POA: Diagnosis not present

## 2016-01-03 NOTE — Progress Notes (Signed)
75 y.o. [name] Hamilton MarriedIndianF here for annual exam.  Doing well.  Pt reports that her abdominal pain is much improved.  She saw Dr. Collene Mares and had CT.  Has been diagnosed with gluten sensitivity.  She does not have celiac disease but the gluten free has really helped.    Denies vaginal bleeding.    PCP:  Dr. Dagmar Hait.  Last visit was in December for physical exam.  Did blood work in December.  She had mildly elevated glucose.  Having this rechecked in six months.    No LMP recorded. Patient is postmenopausal.          Sexually active: Yes.    The current method of family planning is post menopausal status.    Exercising: No.  The patient does not participate in regular exercise at present. Smoker:  no  Health Maintenance: Pap:  09/18/13 Neg History of abnormal Pap:  no MMG:  01/30/15 BIRADS1:neg Colonoscopy:  2012 Normal - repeat 10 years  BMD:   08/2014, osteopenia  TDaP:  2015 Pneumonia vaccines and shingles vaccine:  Completed with [facility]. Screening Labs: PCP, Urine today: PCP   reports that she has never smoked. She has never used smokeless tobacco. She reports that she does not drink alcohol or use illicit drugs.  Past Medical History  Diagnosis Date  . HTN (hypertension)   . Hypercholesteremia   . Anxiety     depression at times  . UTI (urinary tract infection)     hx of  . Chest pain     Nuclear, 2005, normal  . AVM (arteriovenous malformation)        . Ejection fraction     EF 55-60%, echo, September, 2013  . Aortic valve sclerosis     Moderate calcification of the leaflets, echo, September, 2013,  no significant aortic stenosis.  . Mitral regurgitation     Moderate annular calcification, echo, September, 2013, mild  regurgitation  . Pneumonia 1967    hx of  . GERD (gastroesophageal reflux disease)   . Arthritis   . Hearing loss of left ear   . Anemia     with knee replacement  . Fractured coccyx [facility])     Past Surgical History  Procedure Laterality Date  .  Cholecystectomy    . Lumbar spinal stenosis surgery without any complications    . Cardiac catheterization    . Tendon repair Left 2013    in forarm  . Total knee arthroplasty Right 2010    Dr. Cay Schillings  . Total knee arthroplasty Left 02/16/2013    Procedure: LEFT TOTAL KNEE ARTHROPLASTY;  Surgeon: Tobi Bastos, MD;  Location: [facility];  Service: Orthopedics;  Laterality: Left;  . Tonsillectomy      Current Outpatient Prescriptions  Medication Sig Dispense Refill  . aspirin 81 MG tablet Take 81 mg by mouth daily.    Marland Kitchen atenolol (TENORMIN) 50 MG tablet Take 1 tablet by mouth  every morning 90 tablet 0  . atorvastatin (LIPITOR) 20 MG tablet Take 1 tablet (20 mg total) by mouth at bedtime. 90 tablet 3  . esomeprazole (NEXIUM) 20 MG capsule Take 20 mg by mouth daily at 12 noon.    . Naproxen Sodium (ALEVE) 220 MG CAPS Take by mouth daily as needed.    . Probiotic Product (ALIGN) 4 MG CAPS Take by mouth daily.    . quinapril (ACCUPRIL) 20 MG tablet Take 1 tablet by mouth  daily 90 tablet 0  . triamterene-hydrochlorothiazide (MAXZIDE-25)  37.5-25 MG tablet Take 1 tablet by mouth  every morning 90 tablet 0  . zolpidem (AMBIEN) 10 MG tablet Take 5 mg by mouth at bedtime as needed for sleep. prn     No current facility-administered medications for this visit.    Family History  Problem Relation Age of Onset  . Heart attack Father   . Cancer Mother     ROS:  Pertinent items are noted in HPI.  Otherwise, a comprehensive ROS was negative.  Exam:   BP 118/60 mmHg  Pulse 72  Resp 18  Ht 4\' 11"  (1.499 m)  Wt 155 lb (70.308 kg)  BMI 31.29 kg/m2  Weight change: +3#   Height: 4\' 11"  (149.9 cm)  Ht Readings from Last 3 Encounters:  01/03/16 4\' 11"  (1.499 m)  09/05/15 4' 11.25" (1.505 m)  11/05/14 4' 11.25" (1.505 m)    General appearance: alert, cooperative and appears stated age Head: Normocephalic, without obvious abnormality, atraumatic Neck: no adenopathy, supple, symmetrical,  trachea midline and thyroid normal to inspection and palpation Lungs: clear to auscultation bilaterally Breasts: normal appearance, no masses or tenderness Heart: regular rate and rhythm Abdomen: soft, non-tender; bowel sounds normal; no masses,  no organomegaly Extremities: extremities normal, atraumatic, no cyanosis or edema Skin: Skin color, texture, turgor normal. No rashes or lesions Lymph nodes: Cervical, supraclavicular, and axillary nodes normal. No abnormal inguinal nodes palpated Neurologic: Grossly normal   Pelvic: External genitalia:  Enlarging pigmented lesion on left of introitus.  Measures 33mm today              Urethra:  normal appearing urethra with no masses, tenderness or lesions              Bartholins and Skenes: normal                 Vagina: normal appearing vagina with normal color and discharge, no lesions              Cervix: no lesions              Pap taken: Yes.   Bimanual Exam:  Uterus:  normal size, contour, position, consistency, mobility, non-tender              Adnexa: normal adnexa and no mass, fullness, tenderness               Rectovaginal: Confirms               Anus:  normal sphincter tone, no lesions  Recommended removal of lesion today.  Consent obtained.  Pt in agreement.  Area cleansed with Betadine x 3.  1.0cc 1% Lidocaine instilled in lesion.  Area fully excised with pick-ups and sterile scissors.  Silver nitrate used for excellent hemostasis.  Pt tolerated procedure well.  Chaperone was present for exam.  A: Well Woman with normal exam PMP, no HRT Hypertension Hyperlipidemia  Gluten sensitivity with significant improvement in abdominal bloating after stopping gluten Vulvar lesion, pigmented, excised today  P: Mammogram yearly. Doing 3D due to breast density. pap smear done 12/14. Pap smear obtained today. Labs with Dr. Dagmar Hait every six months. Vulvar lesion excised today and sent to pathology.  Results will be called  to pt and follow-up recommended as needed. return annually or prn

## 2016-01-06 LAB — IPS PAP SMEAR ONLY

## 2016-01-14 ENCOUNTER — Telehealth: Payer: Self-pay | Admitting: Emergency Medicine

## 2016-01-14 NOTE — Telephone Encounter (Signed)
Patient notified of results from Dr. Sabra Heck and verbalized understanding. She will follow up as needed. Routing to provider for final review. Patient agreeable to disposition. Will close encounter.

## 2016-01-14 NOTE — Telephone Encounter (Signed)
-----   Message from Megan Salon, MD sent at 01/09/2016  4:23 PM EDT ----- Please let pt know area on her vulva that I removed was a pigmented seborrheic keratosis.  This is benign.  Nothing else needs to be done for this.

## 2016-02-10 NOTE — Patient Instructions (Addendum)
Audre Vanvorst  02/10/2016   Your procedure is scheduled on: 02/18/2016    Report to [facility]  Entrance take [facility]  elevators to 3rd floor to  [place] at     200pm  Call this number if you have problems the morning of surgery [phone]   Remember: ONLY 1 PERSON MAY GO WITH YOU TO SHORT STAY TO GET  READY MORNING OF YOUR SURGERY.  Do not eat food after midnite .  May have clear liquids from 12 midnite until 1000am morning of surgery then nothing by mouth.       Take these medicines the morning of surgery with A SIP OF WATER: Atenolol ( Tenormin), Nexium, Systane eye drops if needed, oxycodone if needed                                 You may not have any metal on your body including hair pins and              piercings  Do not wear jewelry, make-up, lotions, powders or perfumes, deodorant             Do not wear nail polish.  Do not shave  48 hours prior to surgery.              Do not bring valuables to the hospital. [place].  Contacts, dentures or bridgework may not be worn into surgery.  Leave suitcase in the car. After surgery it may be brought to your room.       Special Instructions: coughing and deep breathing exercises, leg exercises               Please read over the following fact sheets you were given: _____________________________________________________________________             [facility] - Preparing for Surgery Before surgery, you can play an important role.  Because skin is not sterile, your skin needs to be as free of germs as possible.  You can reduce the number of germs on your skin by washing with CHG (chlorahexidine gluconate) soap before surgery.  CHG is an antiseptic cleaner which kills germs and bonds with the skin to continue killing germs even after washing. Please DO NOT use if you have an allergy to CHG or antibacterial soaps.  If your skin becomes  reddened/irritated stop using the CHG and inform your nurse when you arrive at Short Stay. Do not shave (including legs and underarms) for at least 48 hours prior to the first CHG shower.  You may shave your face/neck. Please follow these instructions carefully:  1.  Shower with CHG Soap the night before surgery and the  morning of Surgery.  2.  If you choose to wash your hair, wash your hair first as usual with your  normal  shampoo.  3.  After you shampoo, rinse your hair and body thoroughly to remove the  shampoo.                           4.  Use CHG as you would any other liquid soap.  You can apply chg directly  to the skin and  wash                       Gently with a scrungie or clean washcloth.  5.  Apply the CHG Soap to your body ONLY FROM THE NECK DOWN.   Do not use on face/ open                           Wound or open sores. Avoid contact with eyes, ears mouth and genitals (private parts).                       Wash face,  Genitals (private parts) with your normal soap.             6.  Wash thoroughly, paying special attention to the area where your surgery  will be performed.  7.  Thoroughly rinse your body with warm water from the neck down.  8.  DO NOT shower/wash with your normal soap after using and rinsing off  the CHG Soap.                9.  Pat yourself dry with a clean towel.            10.  Wear clean pajamas.            11.  Place clean sheets on your bed the night of your first shower and do not  sleep with pets. Day of Surgery : Do not apply any lotions/deodorants the morning of surgery.  Please wear clean clothes to the hospital/surgery center.  FAILURE TO FOLLOW THESE INSTRUCTIONS MAY RESULT IN THE CANCELLATION OF YOUR SURGERY PATIENT SIGNATURE_________________________________  NURSE SIGNATURE__________________________________  ________________________________________________________________________  WHAT IS A BLOOD TRANSFUSION? Blood Transfusion Information  A  transfusion is the replacement of blood or some of its parts. Blood is made up of multiple cells which provide different functions.  Red blood cells carry oxygen and are used for blood loss replacement.  White blood cells fight against infection.  Platelets control bleeding.  Plasma helps clot blood.  Other blood products are available for specialized needs, such as hemophilia or other clotting disorders. BEFORE THE TRANSFUSION  Who gives blood for transfusions?   Healthy volunteers who are fully evaluated to make sure their blood is safe. This is blood bank blood. Transfusion therapy is the safest it has ever been in the practice of medicine. Before blood is taken from a donor, a complete history is taken to make sure that person has no history of diseases nor engages in risky social behavior (examples are intravenous drug use or sexual activity with multiple partners). The donor's travel history is screened to minimize risk of transmitting infections, such as malaria. The donated blood is tested for signs of infectious diseases, such as HIV and hepatitis. The blood is then tested to be sure it is compatible with you in order to minimize the chance of a transfusion reaction. If you or a relative donates blood, this is often done in anticipation of surgery and is not appropriate for emergency situations. It takes many days to process the donated blood. RISKS AND COMPLICATIONS Although transfusion therapy is very safe and saves many lives, the main dangers of transfusion include:  1. Getting an infectious disease. 2. Developing a transfusion reaction. This is an allergic reaction to something in the blood you were given. Every precaution is taken to prevent this. The decision to  have a blood transfusion has been considered carefully by your caregiver before blood is given. Blood is not given unless the benefits outweigh the risks. AFTER THE TRANSFUSION  Right after receiving a blood  transfusion, you will usually feel much better and more energetic. This is especially true if your red blood cells have gotten low (anemic). The transfusion raises the level of the red blood cells which carry oxygen, and this usually causes an energy increase.  The nurse administering the transfusion will monitor you carefully for complications. HOME CARE INSTRUCTIONS  No special instructions are needed after a transfusion. You may find your energy is better. Speak with your caregiver about any limitations on activity for underlying diseases you may have. SEEK MEDICAL CARE IF:   Your condition is not improving after your transfusion.  You develop redness or irritation at the intravenous (IV) site. SEEK IMMEDIATE MEDICAL CARE IF:  Any of the following symptoms occur over the next 12 hours:  Shaking chills.  You have a temperature by mouth above 102 F (38.9 C), not controlled by medicine.  Chest, back, or muscle pain.  People around you feel you are not acting correctly or are confused.  Shortness of breath or difficulty breathing.  Dizziness and fainting.  You get a rash or develop hives.  You have a decrease in urine output.  Your urine turns a dark color or changes to pink, red, or brown. Any of the following symptoms occur over the next 10 days:  You have a temperature by mouth above 102 F (38.9 C), not controlled by medicine.  Shortness of breath.  Weakness after normal activity.  The white part of the eye turns yellow (jaundice).  You have a decrease in the amount of urine or are urinating less often.  Your urine turns a dark color or changes to pink, red, or brown. Document Released: 09/11/2000 Document Revised: 12/07/2011 Document Reviewed: 04/30/2008 ExitCare Patient Information 2014 [place].  _______________________________________________________________________  Incentive Spirometer  An incentive spirometer is a tool that can help keep your  lungs clear and active. This tool measures how well you are filling your lungs with each breath. Taking long deep breaths may help reverse or decrease the chance of developing breathing (pulmonary) problems (especially infection) following:  A long period of time when you are unable to move or be active. BEFORE THE PROCEDURE   If the spirometer includes an indicator to show your best effort, your nurse or respiratory therapist will set it to a desired goal.  If possible, sit up straight or lean slightly forward. Try not to slouch.  Hold the incentive spirometer in an upright position. INSTRUCTIONS FOR USE  3. Sit on the edge of your bed if possible, or sit up as far as you can in bed or on a chair. 4. Hold the incentive spirometer in an upright position. 5. Breathe out normally. 6. Place the mouthpiece in your mouth and seal your lips tightly around it. 7. Breathe in slowly and as deeply as possible, raising the piston or the ball toward the top of the column. 8. Hold your breath for 3-5 seconds or for as long as possible. Allow the piston or ball to fall to the bottom of the column. 9. Remove the mouthpiece from your mouth and breathe out normally. 10. Rest for a few seconds and repeat Steps 1 through 7 at least 10 times every 1-2 hours when you are awake. Take your time and take a few normal breaths  between deep breaths. 11. The spirometer may include an indicator to show your best effort. Use the indicator as a goal to work toward during each repetition. 12. After each set of 10 deep breaths, practice coughing to be sure your lungs are clear. If you have an incision (the cut made at the time of surgery), support your incision when coughing by placing a pillow or rolled up towels firmly against it. Once you are able to get out of bed, walk around indoors and cough well. You may stop using the incentive spirometer when instructed by your caregiver.  RISKS AND COMPLICATIONS  Take your time so  you do not get dizzy or light-headed.  If you are in pain, you may need to take or ask for pain medication before doing incentive spirometry. It is harder to take a deep breath if you are having pain. AFTER USE  Rest and breathe slowly and easily.  It can be helpful to keep track of a log of your progress. Your caregiver can provide you with a simple table to help with this. If you are using the spirometer at home, follow these instructions: [place] IF:   You are having difficultly using the spirometer.  You have trouble using the spirometer as often as instructed.  Your pain medication is not giving enough relief while using the spirometer.  You develop fever of 100.5 F (38.1 C) or higher. SEEK IMMEDIATE MEDICAL CARE IF:   You cough up bloody sputum that had not been present before.  You develop fever of 102 F (38.9 C) or greater.  You develop worsening pain at or near the incision site. MAKE SURE YOU:   Understand these instructions.  Will watch your condition.  Will get help right away if you are not doing well or get worse. Document Released: 01/25/2007 Document Revised: 12/07/2011 Document Reviewed: 03/28/2007 ExitCare Patient Information 2014 ExitCare, [place].   ________________________________________________________________________    CLEAR LIQUID DIET   Foods Allowed                                                                     Foods Excluded  Coffee and tea, regular and decaf                             liquids that you cannot  Plain Jell-O in any flavor                                             see through such as: Fruit ices (not with fruit pulp)                                     milk, soups, orange juice  Iced Popsicles                                    All solid food Carbonated beverages, regular and diet  Cranberry, grape and apple juices Sports drinks like Gatorade Lightly seasoned clear  broth or consume(fat free) Sugar, honey syrup  Sample Menu Breakfast                                Lunch                                     Supper Cranberry juice                    Beef broth                            Chicken broth Jell-O                                     Grape juice                           Apple juice Coffee or tea                        Jell-O                                      Popsicle                                                Coffee or tea                        Coffee or tea  _____________________________________________________________________

## 2016-02-11 ENCOUNTER — Encounter (HOSPITAL_COMMUNITY): Payer: Self-pay

## 2016-02-11 ENCOUNTER — Other Ambulatory Visit (HOSPITAL_COMMUNITY): Payer: Medicare Other

## 2016-02-11 ENCOUNTER — Encounter (HOSPITAL_COMMUNITY)
Admission: RE | Admit: 2016-02-11 | Discharge: 2016-02-11 | Disposition: A | Discharge status: Home / Self Care | Payer: Medicare Other | Source: Ambulatory Visit | Attending: Orthopedic Surgery | Admitting: Orthopedic Surgery

## 2016-02-11 DIAGNOSIS — M1611 Unilateral primary osteoarthritis, right hip: Secondary | ICD-10-CM | POA: Insufficient documentation

## 2016-02-11 DIAGNOSIS — Z01812 Encounter for preprocedural laboratory examination: Secondary | ICD-10-CM | POA: Insufficient documentation

## 2016-02-11 DIAGNOSIS — Z0183 Encounter for blood typing: Secondary | ICD-10-CM | POA: Insufficient documentation

## 2016-02-11 LAB — BASIC METABOLIC PANEL
Anion gap: 8 (ref 5–15)
BUN: 13 mg/dL (ref 6–20)
CHLORIDE: 103 mmol/L (ref 101–111)
CO2: 29 mmol/L (ref 22–32)
CREATININE: 0.76 mg/dL (ref 0.44–1.00)
Calcium: 9.9 mg/dL (ref 8.9–10.3)
GFR calc non Af Amer: >60 mL/min (ref >60)
Glucose, Bld: 114 mg/dL — ABNORMAL HIGH (ref 65–99)
POTASSIUM: 4.1 mmol/L (ref 3.5–5.1)
Sodium: 140 mmol/L (ref 135–145)

## 2016-02-11 LAB — CBC
HEMATOCRIT: 38.8 % (ref 36.0–46.0)
HEMOGLOBIN: 13.1 g/dL (ref 12.0–15.0)
MCH: 29.3 pg (ref 26.0–34.0)
MCHC: 33.8 g/dL (ref 30.0–36.0)
MCV: 86.8 fL (ref 78.0–100.0)
PLATELETS: 209 10*3/uL (ref 150–400)
RBC: 4.47 MIL/uL (ref 3.87–5.11)
RDW: 13.6 % (ref 11.5–15.5)
WBC: 6.2 10*3/uL (ref 4.0–10.5)

## 2016-02-11 LAB — SURGICAL PCR SCREEN
MRSA, PCR: NEGATIVE
STAPHYLOCOCCUS AUREUS: NEGATIVE

## 2016-02-11 NOTE — H&P (Signed)
TOTAL HIP ADMISSION H&P  Patient is admitted for right total hip arthroplasty, anterior approach.  Subjective:  Chief Complaint:      Right hip primary OA / pain  HPI: [name] Hamilton, 75 y.o. female, has a history of pain and functional disability in the right hip(s) due to arthritis and patient has failed non-surgical conservative treatments for greater than 12 weeks to include NSAID's and/or analgesics, corticosteriod injections, use of assistive devices and activity modification.  Onset of symptoms was gradual starting 1+ years ago with gradually worsening course since that time.The patient noted no past surgery on the right hip(s).  Patient currently rates pain in the right hip at 10 out of 10 with activity. Patient has worsening of pain with activity and weight bearing, trendelenberg gait, pain that interfers with activities of daily living and pain with passive range of motion. Patient has evidence of periarticular osteophytes and joint space narrowing by imaging studies. This condition presents safety issues increasing the risk of falls.  There is no current active infection.  Risks, benefits and expectations were discussed with the patient.  Risks including but not limited to the risk of anesthesia, blood clots, nerve damage, blood vessel damage, failure of the prosthesis, infection and up to and including death.  Patient understand the risks, benefits and expectations and wishes to proceed with surgery.   PCP: Tivis Ringer, MD  D/C Plans:      Home with HHPT  Post-op Meds:       No Rx given   Tranexamic Acid:      To be given - IV    Decadron:      Is to be given  FYI:     ASA   Oxycodone 5-10 mg    Patient Active Problem List   Diagnosis Date Noted  . Acute blood loss anemia 02/17/2013  . Osteoarthritis of left knee 02/16/2013  . Ejection fraction   . Aortic valve sclerosis   . Mitral regurgitation   . Vascular abnormality   . Chest pain   . HYPERCHOLESTEROLEMIA  02/21/2010  . ANXIETY 02/21/2010  . HYPERTENSION 02/21/2010  . UTI'S, HX OF 02/21/2010   Past Medical History  Diagnosis Date  . HTN (hypertension)   . Hypercholesteremia   . Anxiety     depression at times  . UTI (urinary tract infection)     hx of  . Chest pain     Nuclear, 2005, normal  . AVM (arteriovenous malformation)        . Ejection fraction     EF 55-60%, echo, September, 2013  . Aortic valve sclerosis     Moderate calcification of the leaflets, echo, September, 2013,  no significant aortic stenosis.  . Mitral regurgitation     Moderate annular calcification, echo, September, 2013, mild  regurgitation  . Pneumonia 1967    hx of  . GERD (gastroesophageal reflux disease)   . Arthritis   . Hearing loss of left ear   . Anemia     with knee replacement  . Fractured coccyx [facility])     Past Surgical History  Procedure Laterality Date  . Cholecystectomy    . Lumbar spinal stenosis surgery without any complications    . Cardiac catheterization    . Tendon repair Left 2013    in forarm  . Total knee arthroplasty Right 2010    Dr. Cay Schillings  . Total knee arthroplasty Left 02/16/2013    Procedure: LEFT TOTAL KNEE ARTHROPLASTY;  Surgeon: Jori Moll  Fransico Setters, MD;  Location: [facility] ORS;  Service: Orthopedics;  Laterality: Left;  . Tonsillectomy      No prescriptions prior to admission   Allergies  Allergen Reactions  . Gluten Meal Other (See Comments)    Gluten sensitivity - bloating  . Adhesive [Tape] Rash  . Latex Rash    Social History  Substance Use Topics  . Smoking status: Never Smoker   . Smokeless tobacco: Never Used  . Alcohol Use: No     Comment: socially    Family History  Problem Relation Age of Onset  . Heart attack Father   . Cancer Mother      Review of Systems  Constitutional: Negative.   HENT: Positive for hearing loss.   Eyes: Negative.   Respiratory: Negative.   Cardiovascular: Negative.   Gastrointestinal: Positive for heartburn.   Genitourinary: Negative.   Musculoskeletal: Positive for joint pain.  Skin: Negative.   Neurological: Negative.   Endo/Heme/Allergies: Negative.   Psychiatric/Behavioral: Positive for depression. The patient is nervous/anxious and has insomnia.     Objective:  Physical Exam  Constitutional: She is oriented to person, place, and time. She appears well-developed.  HENT:  Head: Normocephalic.  Eyes: Pupils are equal, round, and reactive to light.  Neck: Neck supple. No JVD present. No tracheal deviation present. No thyromegaly present.  Cardiovascular: Normal rate, regular rhythm, normal heart sounds and intact distal pulses.   Respiratory: Effort normal and breath sounds normal. No stridor. No respiratory distress. She has no wheezes.  GI: Soft. There is no tenderness. There is no guarding.  Musculoskeletal:       Right hip: She exhibits decreased range of motion, decreased strength, tenderness and bony tenderness. She exhibits no swelling, no deformity and no laceration.  Lymphadenopathy:    She has no cervical adenopathy.  Neurological: She is alert and oriented to person, place, and time.  Skin: Skin is warm and dry.  Psychiatric: She has a normal mood and affect.     Labs:  Estimated body mass index is 31.29 kg/(m^2) as calculated from the following:   Height as of 01/03/16: 4\' 11"  (1.499 m).   Weight as of 01/03/16: 70.308 kg (155 lb).   Imaging Review Plain radiographs demonstrate severe degenerative joint disease of the right hip(s). The bone quality appears to be good for age and reported activity level.  Assessment/Plan:  End stage arthritis, right hip(s)  The patient history, physical examination, clinical judgement of the provider and imaging studies are consistent with end stage degenerative joint disease of the right hip(s) and total hip arthroplasty is deemed medically necessary. The treatment options including medical management, injection therapy, arthroscopy  and arthroplasty were discussed at length. The risks and benefits of total hip arthroplasty were presented and reviewed. The risks due to aseptic loosening, infection, stiffness, dislocation/subluxation,  thromboembolic complications and other imponderables were discussed.  The patient acknowledged the explanation, agreed to proceed with the plan and consent was signed. Patient is being admitted for inpatient treatment for surgery, pain control, PT, OT, prophylactic antibiotics, VTE prophylaxis, progressive ambulation and ADL's and discharge planning.The patient is planning to be discharged home with home health services.     [place] Pugh Christianna Belmonte   PA-C  02/11/2016, 1:09 PM

## 2016-02-11 NOTE — Progress Notes (Signed)
[place]- 11/14/15 on chart  Dr Dagmar Hait- 01/28/16- on chart ECHO- 12/20/15- on chart  11/14/15- EKG on chart  Stress Test- 01/27/16- on chart  LOV- Dr Ganji-11/14/15- on chart

## 2016-02-18 ENCOUNTER — Inpatient Hospital Stay (HOSPITAL_COMMUNITY): Payer: Medicare Other | Admitting: Anesthesiology

## 2016-02-18 ENCOUNTER — Encounter (HOSPITAL_COMMUNITY): Payer: Self-pay | Admitting: *Deleted

## 2016-02-18 ENCOUNTER — Inpatient Hospital Stay (HOSPITAL_COMMUNITY): Payer: Medicare Other

## 2016-02-18 ENCOUNTER — Inpatient Hospital Stay (HOSPITAL_COMMUNITY)
Admission: AD | Admit: 2016-02-18 | Discharge: 2016-02-20 | DRG: 470 | Disposition: A | Discharge status: Home Health | Payer: Medicare Other | Source: Ambulatory Visit | Attending: Orthopedic Surgery | Admitting: Orthopedic Surgery

## 2016-02-18 ENCOUNTER — Encounter (HOSPITAL_COMMUNITY)
Admission: AD | Disposition: A | Discharge status: Home Health | Payer: Self-pay | Source: Ambulatory Visit | Attending: Orthopedic Surgery

## 2016-02-18 DIAGNOSIS — M1611 Unilateral primary osteoarthritis, right hip: Secondary | ICD-10-CM | POA: Diagnosis present

## 2016-02-18 DIAGNOSIS — K219 Gastro-esophageal reflux disease without esophagitis: Secondary | ICD-10-CM | POA: Diagnosis present

## 2016-02-18 DIAGNOSIS — F419 Anxiety disorder, unspecified: Secondary | ICD-10-CM | POA: Diagnosis present

## 2016-02-18 DIAGNOSIS — M25551 Pain in right hip: Secondary | ICD-10-CM

## 2016-02-18 DIAGNOSIS — Z6830 Body mass index (BMI) 30.0-30.9, adult: Secondary | ICD-10-CM

## 2016-02-18 DIAGNOSIS — Z96649 Presence of unspecified artificial hip joint: Secondary | ICD-10-CM

## 2016-02-18 DIAGNOSIS — E669 Obesity, unspecified: Secondary | ICD-10-CM | POA: Diagnosis present

## 2016-02-18 DIAGNOSIS — F329 Major depressive disorder, single episode, unspecified: Secondary | ICD-10-CM | POA: Diagnosis present

## 2016-02-18 DIAGNOSIS — Z96653 Presence of artificial knee joint, bilateral: Secondary | ICD-10-CM | POA: Diagnosis present

## 2016-02-18 DIAGNOSIS — H9192 Unspecified hearing loss, left ear: Secondary | ICD-10-CM | POA: Diagnosis present

## 2016-02-18 DIAGNOSIS — I10 Essential (primary) hypertension: Secondary | ICD-10-CM | POA: Diagnosis present

## 2016-02-18 HISTORY — PX: TOTAL HIP ARTHROPLASTY: SHX124

## 2016-02-18 LAB — TYPE AND SCREEN
ABO/RH(D): B POS
ANTIBODY SCREEN: NEGATIVE

## 2016-02-18 SURGERY — ARTHROPLASTY, HIP, TOTAL, ANTERIOR APPROACH
Anesthesia: Spinal | Site: Hip | Laterality: Right

## 2016-02-18 MED ORDER — CELECOXIB 200 MG PO CAPS
200.0000 mg | ORAL_CAPSULE | Freq: Two times a day (BID) | ORAL | Status: DC
  Administered 2016-02-18 – 2016-02-20 (×4): 200 mg via ORAL
  Filled 2016-02-18 (×4): qty 1

## 2016-02-18 MED ORDER — ACETAMINOPHEN 325 MG PO TABS
650.0000 mg | ORAL_TABLET | Freq: Four times a day (QID) | ORAL | Status: DC | PRN
  Administered 2016-02-19: 650 mg via ORAL
  Filled 2016-02-18: qty 2

## 2016-02-18 MED ORDER — TRANEXAMIC ACID 1000 MG/10ML IV SOLN
1000.0000 mg | Freq: Once | INTRAVENOUS | Status: AC
  Administered 2016-02-18: 1000 mg via INTRAVENOUS
  Filled 2016-02-18: qty 10

## 2016-02-18 MED ORDER — BUPIVACAINE HCL (PF) 0.5 % IJ SOLN
INTRAMUSCULAR | Status: DC | PRN
  Administered 2016-02-18: 3 mL

## 2016-02-18 MED ORDER — EPHEDRINE SULFATE 50 MG/ML IJ SOLN
INTRAMUSCULAR | Status: AC
  Filled 2016-02-18: qty 1

## 2016-02-18 MED ORDER — SODIUM CHLORIDE 0.9 % IV SOLN
INTRAVENOUS | Status: DC
  Administered 2016-02-18: 23:00:00 via INTRAVENOUS

## 2016-02-18 MED ORDER — ONDANSETRON HCL 4 MG PO TABS: 4.0000 mg | ORAL_TABLET | Freq: Four times a day (QID) | ORAL | Status: DC | PRN

## 2016-02-18 MED ORDER — METOCLOPRAMIDE HCL 5 MG PO TABS: 5.0000 mg | ORAL_TABLET | Freq: Three times a day (TID) | ORAL | Status: DC | PRN

## 2016-02-18 MED ORDER — METHOCARBAMOL 500 MG PO TABS
500.0000 mg | ORAL_TABLET | Freq: Four times a day (QID) | ORAL | Status: DC | PRN
  Administered 2016-02-19 – 2016-02-20 (×4): 500 mg via ORAL
  Filled 2016-02-18 (×4): qty 1

## 2016-02-18 MED ORDER — PHENOL 1.4 % MT LIQD
1.0000 | OROMUCOSAL | Status: DC | PRN
  Filled 2016-02-18: qty 177

## 2016-02-18 MED ORDER — MEPERIDINE HCL 50 MG/ML IJ SOLN: 6.2500 mg | INTRAMUSCULAR | Status: DC | PRN

## 2016-02-18 MED ORDER — ONDANSETRON HCL 4 MG/2ML IJ SOLN
INTRAMUSCULAR | Status: AC
  Filled 2016-02-18: qty 2

## 2016-02-18 MED ORDER — ONDANSETRON HCL 4 MG/2ML IJ SOLN
INTRAMUSCULAR | Status: DC | PRN
  Administered 2016-02-18: 4 mg via INTRAVENOUS

## 2016-02-18 MED ORDER — FERROUS SULFATE 325 (65 FE) MG PO TABS
325.0000 mg | ORAL_TABLET | Freq: Three times a day (TID) | ORAL | Status: DC
  Administered 2016-02-19 – 2016-02-20 (×4): 325 mg via ORAL
  Filled 2016-02-18 (×4): qty 1

## 2016-02-18 MED ORDER — METHOCARBAMOL 1000 MG/10ML IJ SOLN
500.0000 mg | Freq: Four times a day (QID) | INTRAVENOUS | Status: DC | PRN
  Filled 2016-02-18: qty 5

## 2016-02-18 MED ORDER — OXYCODONE HCL 5 MG PO TABS
5.0000 mg | ORAL_TABLET | ORAL | Status: DC
  Administered 2016-02-18: 5 mg via ORAL
  Administered 2016-02-19: 10 mg via ORAL
  Administered 2016-02-19: 15 mg via ORAL
  Administered 2016-02-19: 10 mg via ORAL
  Filled 2016-02-18: qty 2
  Filled 2016-02-18: qty 1
  Filled 2016-02-18: qty 3
  Filled 2016-02-18: qty 2
  Filled 2016-02-18: qty 3

## 2016-02-18 MED ORDER — PROPOFOL 500 MG/50ML IV EMUL
INTRAVENOUS | Status: DC | PRN
  Administered 2016-02-18: 50 ug/kg/min via INTRAVENOUS

## 2016-02-18 MED ORDER — PHENYLEPHRINE HCL 10 MG/ML IJ SOLN
INTRAMUSCULAR | Status: AC
  Filled 2016-02-18: qty 1

## 2016-02-18 MED ORDER — ATENOLOL 25 MG PO TABS
50.0000 mg | ORAL_TABLET | Freq: Every day | ORAL | Status: DC
  Filled 2016-02-18 (×2): qty 2

## 2016-02-18 MED ORDER — MIDAZOLAM HCL 2 MG/2ML IJ SOLN
INTRAMUSCULAR | Status: AC
  Filled 2016-02-18: qty 2

## 2016-02-18 MED ORDER — PROPOFOL 10 MG/ML IV BOLUS
INTRAVENOUS | Status: AC
  Filled 2016-02-18: qty 40

## 2016-02-18 MED ORDER — ONDANSETRON HCL 4 MG/2ML IJ SOLN: 4.0000 mg | Freq: Four times a day (QID) | INTRAMUSCULAR | Status: DC | PRN

## 2016-02-18 MED ORDER — POLYETHYLENE GLYCOL 3350 17 G PO PACK
17.0000 g | PACK | Freq: Two times a day (BID) | ORAL | Status: DC
  Administered 2016-02-18 – 2016-02-20 (×3): 17 g via ORAL
  Filled 2016-02-18 (×3): qty 1

## 2016-02-18 MED ORDER — BISACODYL 10 MG RE SUPP: 10.0000 mg | Freq: Every day | RECTAL | Status: DC | PRN

## 2016-02-18 MED ORDER — SODIUM CHLORIDE 0.9 % IJ SOLN
INTRAMUSCULAR | Status: AC
  Filled 2016-02-18: qty 10

## 2016-02-18 MED ORDER — EPHEDRINE SULFATE 50 MG/ML IJ SOLN
INTRAMUSCULAR | Status: DC | PRN
  Administered 2016-02-18 (×2): 10 mg via INTRAVENOUS

## 2016-02-18 MED ORDER — LACTATED RINGERS IV SOLN
INTRAVENOUS | Status: DC | PRN
  Administered 2016-02-18 (×2): via INTRAVENOUS

## 2016-02-18 MED ORDER — PHENYLEPHRINE HCL 10 MG/ML IJ SOLN
INTRAMUSCULAR | Status: DC | PRN
  Administered 2016-02-18 (×4): 80 ug via INTRAVENOUS

## 2016-02-18 MED ORDER — HYDROMORPHONE HCL 1 MG/ML IJ SOLN
0.5000 mg | INTRAMUSCULAR | Status: DC | PRN
  Administered 2016-02-18 – 2016-02-19 (×2): 1 mg via INTRAVENOUS
  Filled 2016-02-18 (×2): qty 1

## 2016-02-18 MED ORDER — PHENYLEPHRINE 40 MCG/ML (10ML) SYRINGE FOR IV PUSH (FOR BLOOD PRESSURE SUPPORT)
PREFILLED_SYRINGE | INTRAVENOUS | Status: AC
  Filled 2016-02-18: qty 10

## 2016-02-18 MED ORDER — PROMETHAZINE HCL 25 MG/ML IJ SOLN
INTRAMUSCULAR | Status: AC
  Filled 2016-02-18: qty 1

## 2016-02-18 MED ORDER — SODIUM CHLORIDE 0.9 % IR SOLN
Status: DC | PRN
  Administered 2016-02-18: 1000 mL

## 2016-02-18 MED ORDER — DIPHENHYDRAMINE HCL 25 MG PO CAPS: 25.0000 mg | ORAL_CAPSULE | Freq: Four times a day (QID) | ORAL | Status: DC | PRN

## 2016-02-18 MED ORDER — FENTANYL CITRATE (PF) 100 MCG/2ML IJ SOLN
INTRAMUSCULAR | Status: DC | PRN
  Administered 2016-02-18: 25 ug via INTRAVENOUS
  Administered 2016-02-18: 50 ug via INTRAVENOUS
  Administered 2016-02-18: 25 ug via INTRAVENOUS

## 2016-02-18 MED ORDER — ACETAMINOPHEN 650 MG RE SUPP: 650.0000 mg | Freq: Four times a day (QID) | RECTAL | Status: DC | PRN

## 2016-02-18 MED ORDER — ASPIRIN EC 325 MG PO TBEC
325.0000 mg | DELAYED_RELEASE_TABLET | Freq: Two times a day (BID) | ORAL | Status: DC
  Administered 2016-02-19 – 2016-02-20 (×3): 325 mg via ORAL
  Filled 2016-02-18 (×3): qty 1

## 2016-02-18 MED ORDER — DEXAMETHASONE SODIUM PHOSPHATE 10 MG/ML IJ SOLN
10.0000 mg | Freq: Once | INTRAMUSCULAR | Status: AC
  Administered 2016-02-18: 10 mg via INTRAVENOUS

## 2016-02-18 MED ORDER — ONDANSETRON HCL 4 MG/2ML IJ SOLN: 4.0000 mg | Freq: Once | INTRAMUSCULAR | Status: DC | PRN

## 2016-02-18 MED ORDER — DOCUSATE SODIUM 100 MG PO CAPS
100.0000 mg | ORAL_CAPSULE | Freq: Two times a day (BID) | ORAL | Status: DC
  Administered 2016-02-18 – 2016-02-20 (×4): 100 mg via ORAL
  Filled 2016-02-18 (×4): qty 1

## 2016-02-18 MED ORDER — CEFAZOLIN SODIUM-DEXTROSE 2-4 GM/100ML-% IV SOLN
2.0000 g | INTRAVENOUS | Status: AC
  Administered 2016-02-18: 2 g via INTRAVENOUS
  Filled 2016-02-18 (×2): qty 100

## 2016-02-18 MED ORDER — ATORVASTATIN CALCIUM 20 MG PO TABS
20.0000 mg | ORAL_TABLET | Freq: Every day | ORAL | Status: DC
  Administered 2016-02-18 – 2016-02-19 (×2): 20 mg via ORAL
  Filled 2016-02-18 (×2): qty 1

## 2016-02-18 MED ORDER — MIDAZOLAM HCL 5 MG/5ML IJ SOLN
INTRAMUSCULAR | Status: DC | PRN
  Administered 2016-02-18: 2 mg via INTRAVENOUS

## 2016-02-18 MED ORDER — DEXTROSE 5 % IV SOLN
10.0000 mg | INTRAVENOUS | Status: DC | PRN
  Administered 2016-02-18: 50 ug/min via INTRAVENOUS

## 2016-02-18 MED ORDER — MAGNESIUM CITRATE PO SOLN: 1.0000 | Freq: Once | ORAL | Status: DC | PRN

## 2016-02-18 MED ORDER — DEXAMETHASONE SODIUM PHOSPHATE 10 MG/ML IJ SOLN
10.0000 mg | Freq: Once | INTRAMUSCULAR | Status: AC
  Administered 2016-02-19: 10 mg via INTRAVENOUS
  Filled 2016-02-18: qty 1

## 2016-02-18 MED ORDER — METOCLOPRAMIDE HCL 5 MG/ML IJ SOLN: 5.0000 mg | Freq: Three times a day (TID) | INTRAMUSCULAR | Status: DC | PRN

## 2016-02-18 MED ORDER — CEFAZOLIN SODIUM-DEXTROSE 2-4 GM/100ML-% IV SOLN
2.0000 g | Freq: Four times a day (QID) | INTRAVENOUS | Status: AC
  Administered 2016-02-19 (×2): 2 g via INTRAVENOUS
  Filled 2016-02-18 (×2): qty 100

## 2016-02-18 MED ORDER — PANTOPRAZOLE SODIUM 40 MG PO TBEC: 40.0000 mg | DELAYED_RELEASE_TABLET | Freq: Every day | ORAL | Status: DC | PRN

## 2016-02-18 MED ORDER — MENTHOL 3 MG MT LOZG: 1.0000 | LOZENGE | OROMUCOSAL | Status: DC | PRN

## 2016-02-18 MED ORDER — BUPIVACAINE HCL (PF) 0.5 % IJ SOLN
INTRAMUSCULAR | Status: AC
  Filled 2016-02-18: qty 30

## 2016-02-18 MED ORDER — CEFAZOLIN SODIUM-DEXTROSE 2-4 GM/100ML-% IV SOLN
INTRAVENOUS | Status: AC
  Filled 2016-02-18: qty 100

## 2016-02-18 MED ORDER — FENTANYL CITRATE (PF) 100 MCG/2ML IJ SOLN
INTRAMUSCULAR | Status: AC
  Filled 2016-02-18: qty 2

## 2016-02-18 MED ORDER — TRIAMTERENE-HCTZ 37.5-25 MG PO TABS
1.0000 | ORAL_TABLET | Freq: Every day | ORAL | Status: DC
  Filled 2016-02-18 (×2): qty 1

## 2016-02-18 MED ORDER — DEXAMETHASONE SODIUM PHOSPHATE 10 MG/ML IJ SOLN
INTRAMUSCULAR | Status: AC
  Filled 2016-02-18: qty 1

## 2016-02-18 MED ORDER — ZOLPIDEM TARTRATE 5 MG PO TABS: 5.0000 mg | ORAL_TABLET | Freq: Every evening | ORAL | Status: DC | PRN

## 2016-02-18 MED ORDER — HYDROMORPHONE HCL 1 MG/ML IJ SOLN: 0.2500 mg | INTRAMUSCULAR | Status: DC | PRN

## 2016-02-18 MED ORDER — ALUM & MAG HYDROXIDE-SIMETH 200-200-20 MG/5ML PO SUSP: 30.0000 mL | ORAL | Status: DC | PRN

## 2016-02-18 MED ORDER — CHLORHEXIDINE GLUCONATE 4 % EX LIQD: 60.0000 mL | Freq: Once | CUTANEOUS | Status: DC

## 2016-02-18 SURGICAL SUPPLY — 39 items
BAG DECANTER FOR FLEXI CONT (MISCELLANEOUS) IMPLANT
BAG SPEC THK2 15X12 ZIP CLS (MISCELLANEOUS)
BAG ZIPLOCK 12X15 (MISCELLANEOUS) IMPLANT
CAPT HIP TOTAL 2 ×2 IMPLANT
CLOTH BEACON ORANGE TIMEOUT ST (SAFETY) ×3 IMPLANT
COVER PERINEAL POST (MISCELLANEOUS) ×3 IMPLANT
DRAPE STERI IOBAN 125X83 (DRAPES) ×3 IMPLANT
DRAPE U-SHAPE 47X51 STRL (DRAPES) ×6 IMPLANT
DRESSING AQUACEL AG SP 3.5X10 (GAUZE/BANDAGES/DRESSINGS) ×1 IMPLANT
DRSG AQUACEL AG SP 3.5X10 (GAUZE/BANDAGES/DRESSINGS) ×3
DURAPREP 26ML APPLICATOR (WOUND CARE) ×3 IMPLANT
ELECT REM PT RETURN 15FT ADLT (MISCELLANEOUS) IMPLANT
ELECT REM PT RETURN 9FT ADLT (ELECTROSURGICAL) ×3
ELECTRODE REM PT RTRN 9FT ADLT (ELECTROSURGICAL) ×1 IMPLANT
GLOVE BIOGEL M STRL SZ7.5 (GLOVE) IMPLANT
GLOVE BIOGEL PI IND STRL 7.5 (GLOVE) ×1 IMPLANT
GLOVE BIOGEL PI IND STRL 8.5 (GLOVE) ×1 IMPLANT
GLOVE BIOGEL PI INDICATOR 7.5 (GLOVE) ×2
GLOVE BIOGEL PI INDICATOR 8.5 (GLOVE) ×2
GLOVE ECLIPSE 8.0 STRL XLNG CF (GLOVE) ×2 IMPLANT
GLOVE ORTHO TXT STRL SZ7.5 (GLOVE) ×1 IMPLANT
GLOVE SURG SS PI 7.5 STRL IVOR (GLOVE) ×4 IMPLANT
GLOVE SURG SS PI 8.5 STRL IVOR (GLOVE) ×4
GLOVE SURG SS PI 8.5 STRL STRW (GLOVE) IMPLANT
GOWN STRL REUS W/TWL LRG LVL3 (GOWN DISPOSABLE) ×3 IMPLANT
GOWN STRL REUS W/TWL XL LVL3 (GOWN DISPOSABLE) ×3 IMPLANT
HOLDER FOLEY CATH W/STRAP (MISCELLANEOUS) ×3 IMPLANT
LIQUID BAND (GAUZE/BANDAGES/DRESSINGS) ×3 IMPLANT
PACK ANTERIOR HIP CUSTOM (KITS) ×3 IMPLANT
SAW OSC TIP CART 19.5X105X1.3 (SAW) ×3 IMPLANT
SUT MNCRL AB 4-0 PS2 18 (SUTURE) ×3 IMPLANT
SUT VIC AB 1 CT1 36 (SUTURE) ×9 IMPLANT
SUT VIC AB 2-0 CT1 27 (SUTURE) ×6
SUT VIC AB 2-0 CT1 TAPERPNT 27 (SUTURE) ×2 IMPLANT
SUT VLOC 180 0 24IN GS25 (SUTURE) ×3 IMPLANT
TRAY FOLEY W/METER SILVER 14FR (SET/KITS/TRAYS/PACK) IMPLANT
TRAY FOLEY W/METER SILVER 16FR (SET/KITS/TRAYS/PACK) IMPLANT
WATER STERILE IRR 1500ML POUR (IV SOLUTION) ×3 IMPLANT
YANKAUER SUCT BULB TIP 10FT TU (MISCELLANEOUS) IMPLANT

## 2016-02-18 NOTE — Anesthesia Procedure Notes (Signed)
Spinal Patient location during procedure: OR Start time: 02/18/2016 6:17 PM End time: 02/18/2016 6:21 PM Staffing Anesthesiologist: Lillia Abed Performed by: anesthesiologist  Preanesthetic Checklist Completed: patient identified, site marked, surgical consent, pre-op evaluation, timeout performed, IV checked, risks and benefits discussed and monitors and equipment checked Spinal Block Patient position: sitting Prep: Betadine Patient monitoring: heart rate, cardiac monitor, continuous pulse ox and blood pressure Approach: right paramedian Location: L2-3 Injection technique: single-shot Needle Needle type: Pencan  Needle gauge: 24 G Needle length: 9 cm Needle insertion depth: 6 cm

## 2016-02-18 NOTE — Anesthesia Postprocedure Evaluation (Signed)
Anesthesia Post Note  Patient: [name] Hamilton  Procedure(s) Performed: Procedure(s) (LRB): RIGHT TOTAL HIP ARTHROPLASTY ANTERIOR APPROACH (Right)  Patient location during evaluation: PACU Anesthesia Type: Spinal Level of consciousness: oriented and awake and alert Pain management: pain level controlled Vital Signs Assessment: post-procedure vital signs reviewed and stable Respiratory status: spontaneous breathing, respiratory function stable and patient connected to nasal cannula oxygen Cardiovascular status: blood pressure returned to baseline and stable Postop Assessment: no headache and no backache Anesthetic complications: no    Last Vitals:  Filed Vitals:   02/18/16 2000 02/18/16 2030  BP: 101/64 108/79  Pulse: 69 63  Temp: 36.8 C   Resp: 14 14    Last Pain:  Filed Vitals:   02/18/16 2051  PainSc: 0-No pain                 Ahad Colarusso DAVID

## 2016-02-18 NOTE — Anesthesia Preprocedure Evaluation (Signed)
Anesthesia Evaluation  Patient identified by MRN, date of birth, ID band Patient awake    Reviewed: Allergy & Precautions, NPO status , Patient's Chart, lab work & pertinent test results  Airway Mallampati: I  TM Distance: >3 FB Neck ROM: Full    Dental   Pulmonary    Pulmonary exam normal        Cardiovascular hypertension, Pt. on medications Normal cardiovascular exam     Neuro/Psych Anxiety    GI/Hepatic GERD  Controlled and Medicated,  Endo/Other    Renal/GU      Musculoskeletal   Abdominal   Peds  Hematology   Anesthesia Other Findings   Reproductive/Obstetrics                             Anesthesia Physical Anesthesia Plan  ASA: II  Anesthesia Plan: Spinal   Post-op Pain Management:    Induction: Intravenous  Airway Management Planned: Natural Airway  Additional Equipment:   Intra-op Plan:   Post-operative Plan:   Informed Consent: I have reviewed the patients History and Physical, chart, labs and discussed the procedure including the risks, benefits and alternatives for the proposed anesthesia with the patient or authorized representative who has indicated his/her understanding and acceptance.     Plan Discussed with: CRNA and Surgeon  Anesthesia Plan Comments:         Anesthesia Quick Evaluation

## 2016-02-18 NOTE — Progress Notes (Signed)
Patient and family informed of delay in surgery. Emotional support given and two warm blankets. Patient is okay with delay.

## 2016-02-18 NOTE — Transfer of Care (Signed)
Immediate Anesthesia Transfer of Care Note  Patient: [name] Hamilton  Procedure(s) Performed: Procedure(s): RIGHT TOTAL HIP ARTHROPLASTY ANTERIOR APPROACH (Right)  Patient Location: PACU  Anesthesia Type:Spinal  Level of Consciousness:  sedated, patient cooperative and responds to stimulation  Airway & Oxygen Therapy:Patient Spontanous Breathing and Patient connected to face mask oxgen  Post-op Assessment:  Report given to PACU RN and Post -op Vital signs reviewed and stable  Post vital signs:  Reviewed and stable  Last Vitals:  Filed Vitals:   02/18/16 1421  BP: 135/80  Pulse: 63  Temp: 36.7 C  Resp: 16    Complications: No apparent anesthesia complications

## 2016-02-18 NOTE — Interval H&P Note (Signed)
History and Physical Interval Note:  02/18/2016 5:19 PM  [name] Hamilton  has presented today for surgery, with the diagnosis of RIGHT HIP OA  The various methods of treatment have been discussed with the patient and family. After consideration of risks, benefits and other options for treatment, the patient has consented to  Procedure(s): RIGHT TOTAL HIP ARTHROPLASTY ANTERIOR APPROACH (Right) as a surgical intervention .  The patient's history has been reviewed, patient examined, no change in status, stable for surgery.  I have reviewed the patient's chart and labs.  Questions were answered to the patient's satisfaction.     Mauri Pole

## 2016-02-19 ENCOUNTER — Encounter (HOSPITAL_COMMUNITY): Payer: Self-pay | Admitting: Orthopedic Surgery

## 2016-02-19 LAB — BASIC METABOLIC PANEL
Anion gap: 8 (ref 5–15)
BUN: 13 mg/dL (ref 6–20)
CALCIUM: 8.9 mg/dL (ref 8.9–10.3)
CO2: 27 mmol/L (ref 22–32)
Chloride: 100 mmol/L — ABNORMAL LOW (ref 101–111)
Creatinine, Ser: 0.73 mg/dL (ref 0.44–1.00)
GFR calc Af Amer: >60 mL/min (ref >60)
GLUCOSE: 168 mg/dL — AB (ref 65–99)
POTASSIUM: 3.6 mmol/L (ref 3.5–5.1)
Sodium: 135 mmol/L (ref 135–145)

## 2016-02-19 LAB — CBC
HCT: 34.1 % — ABNORMAL LOW (ref 36.0–46.0)
Hemoglobin: 11.3 g/dL — ABNORMAL LOW (ref 12.0–15.0)
MCH: 28.8 pg (ref 26.0–34.0)
MCHC: 33.1 g/dL (ref 30.0–36.0)
MCV: 86.8 fL (ref 78.0–100.0)
PLATELETS: 175 10*3/uL (ref 150–400)
RBC: 3.93 MIL/uL (ref 3.87–5.11)
RDW: 13.7 % (ref 11.5–15.5)
WBC: 7.3 10*3/uL (ref 4.0–10.5)

## 2016-02-19 MED ORDER — TRAMADOL HCL 50 MG PO TABS
50.0000 mg | ORAL_TABLET | Freq: Four times a day (QID) | ORAL | Status: DC | PRN
  Administered 2016-02-19 – 2016-02-20 (×3): 100 mg via ORAL
  Filled 2016-02-19 (×3): qty 2

## 2016-02-19 MED ORDER — SODIUM CHLORIDE 0.9 % IV BOLUS (SEPSIS)
500.0000 mL | Freq: Once | INTRAVENOUS | Status: AC
  Administered 2016-02-19: 500 mL via INTRAVENOUS

## 2016-02-19 NOTE — Evaluation (Signed)
Physical Therapy Evaluation Patient Details Name: [name] Hamilton MRN: [id number] DOB: [birth year] Today's Date: 02/19/2016   History of Present Illness  75 yo female s/p R THA-direct anterior 02/18/16. Hx of chronic L quad weakness.   Clinical Impression  On eval, pt required Min assist for mobility-walked ~150 feet with RW. Pt tolerated activity fairly well. Fatigued after walk. Will progress activity as tolerated.     Follow Up Recommendations Home health PT    Equipment Recommendations  None recommended by PT    Recommendations for Other Services       Precautions / Restrictions Precautions Precautions: Fall Restrictions Weight Bearing Restrictions: No RLE Weight Bearing: Weight bearing as tolerated      Mobility  Bed Mobility Overal bed mobility: Needs Assistance Bed Mobility: Supine to Sit     Supine to sit: Min assist     General bed mobility comments: Assist for R LE and to scoot to EOB. Increased time. VCs safety, technique.   Transfers Overall transfer level: Needs assistance Equipment used: Rolling walker (2 wheeled) Transfers: Sit to/from Stand Sit to Stand: Min assist         General transfer comment: Assist to rise, stabilize, control descent. VCs safety, technique, hand placement  Ambulation/Gait Ambulation/Gait assistance: Min guard Ambulation Distance (Feet): 150 Feet Assistive device: Rolling walker (2 wheeled) Gait Pattern/deviations: Step-to pattern;Decreased stride length;Step-through pattern     General Gait Details: close guard for safety. slow gait speed. VCs safety, technique, sequence.   Stairs            Wheelchair Mobility    Modified Rankin (Stroke Patients Only)       Balance                                             Pertinent Vitals/Pain Pain Assessment: 0-10 Pain Score: 5  Pain Location: R hip with activity Pain Descriptors / Indicators: Sore Pain Intervention(s): Monitored during  session;Ice applied;Repositioned    Home Living Family/patient expects to be discharged to:: Private residence Living Arrangements: Spouse/significant other Available Help at Discharge: Family Type of Home: House Home Access: Stairs to enter Entrance Stairs-Rails: None Entrance Stairs-Number of Steps: 4-5 Home Layout: Two level Home Equipment: Environmental consultant - 2 wheels      Prior Function Level of Independence: Independent               Hand Dominance        Extremity/Trunk Assessment   Upper Extremity Assessment: Defer to OT evaluation           Lower Extremity Assessment: Generalized weakness      Cervical / Trunk Assessment: Normal  Communication   Communication: No difficulties  Cognition Arousal/Alertness: Awake/alert Behavior During Therapy: WFL for tasks assessed/performed Overall Cognitive Status: Within Functional Limits for tasks assessed                      General Comments      Exercises        Assessment/Plan    PT Assessment Patient needs continued PT services  PT Diagnosis Difficulty walking;Acute pain   PT Problem List Decreased strength;Decreased range of motion;Decreased activity tolerance;Decreased balance;Decreased mobility;Decreased knowledge of use of DME;Pain  PT Treatment Interventions DME instruction;Gait training;Stair training;Functional mobility training;Therapeutic activities;Patient/family education;Balance training;Therapeutic exercise   PT Goals (Current goals can be found in the Care  Plan section) Acute Rehab PT Goals Patient Stated Goal: home tomorrow PT Goal Formulation: With patient/family Time For Goal Achievement: 02/26/16 Potential to Achieve Goals: Good    Frequency 7X/week   Barriers to discharge        Co-evaluation               End of Session Equipment Utilized During Treatment: Gait belt Activity Tolerance: Patient tolerated treatment well Patient left: in chair;with call bell/phone  within reach;with chair alarm set;with family/visitor present           Time: [id number] PT Time Calculation (min) (ACUTE ONLY): 27 min   Charges:   PT Evaluation $PT Eval Low Complexity: 1 Procedure PT Treatments $Gait Training: 8-22 mins   PT G Codes:        Weston Anna, MPT Pager: [phone]

## 2016-02-19 NOTE — Evaluation (Signed)
Occupational Therapy Evaluation Patient Details Name: [name] Hamilton MRN: [id number] DOB: [birth year] Today's Date: 02/19/2016    History of Present Illness 75 yo female s/p R THA-direct anterior 02/18/16. Hx of chronic L quad weakness.    Clinical Impression   Pt was admitted for the above. 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 Reviewed all education.  No further OT is needed at this time    Follow Up Recommendations  No OT follow up    Equipment Recommendations  None recommended by OT    Recommendations for Other Services       Precautions / Restrictions Precautions Precautions: Fall Restrictions Weight Bearing Restrictions: No RLE Weight Bearing: Weight bearing as tolerated      Mobility Bed Mobility Overal bed mobility: Needs Assistance Bed Mobility: Supine to Sit     Supine to sit: Min assist     General bed mobility comments: oob  Transfers Overall transfer level: Needs assistance Equipment used: Rolling walker (2 wheeled) Transfers: Sit to/from Stand Sit to Stand: Min guard;Min assist         General transfer comment: min A from chair; min guard from commode with grab bar. Cues for UE/LE placement    Balance                                            ADL Overall ADL's : Needs assistance/impaired     Grooming: Oral care;Supervision/safety;Standing                   Toilet Transfer: Min guard;Ambulation;Comfort height toilet;Grab bars;RW   Toileting- Water quality scientist and Hygiene: Min guard;Sit to/from stand         General ADL Comments: pt will have family assist with adls. She has reacher and long sponge if desired. Ambulated to bathroom and performed the above activities.  She did not want to practice shower at this time. Reviewed sequence.  Pt's LLE had been the weaker leg prior to this sx.  She will follow same sequence as steps when she does these.  Pt tends to lead with LLE when ambulating     Vision      Perception     Praxis      Pertinent Vitals/Pain Pain Assessment: 0-10 Pain Score: 5  Pain Location: R hip Pain Descriptors / Indicators: Sore Pain Intervention(s): Limited activity within patient's tolerance;Monitored during session;Premedicated before session;Repositioned;Ice applied     Hand Dominance     Extremity/Trunk Assessment Upper Extremity Assessment Upper Extremity Assessment: Overall WFL for tasks assessed      Cervical / Trunk Assessment Cervical / Trunk Assessment: Normal   Communication Communication Communication: No difficulties   Cognition Arousal/Alertness: Awake/alert Behavior During Therapy: WFL for tasks assessed/performed Overall Cognitive Status: Within Functional Limits for tasks assessed                     General Comments       Exercises       Shoulder Instructions      Home Living Family/patient expects to be discharged to:: Private residence Living Arrangements: Spouse/significant other Available Help at Discharge: Family Type of Home: House Home Access: Stairs to enter Technical brewer of Steps: 4-5 Entrance Stairs-Rails: None Home Layout: Two level Alternate Level Stairs-Number of Steps: has stair chair lift   Bathroom Shower/Tub: Occupational psychologist: Standard  Home Equipment: Bedside commode          Prior Functioning/Environment Level of Independence: Independent             OT Diagnosis: Acute pain   OT Problem List:     OT Treatment/Interventions:      OT Goals(Current goals can be found in the care plan section) Acute Rehab OT Goals Patient Stated Goal: home tomorrow OT Goal Formulation: All assessment and education complete, DC therapy  OT Frequency:     Barriers to D/C:            Co-evaluation              End of Session    Activity Tolerance: Patient tolerated treatment well Patient left: in chair;with call bell/phone within reach;with chair alarm  set   Time: [id number] OT Time Calculation (min): 25 min Charges:  OT General Charges $OT Visit: 1 Procedure OT Evaluation $OT Eval Low Complexity: 1 Procedure G-Codes:    Hafiz Irion 2016/02/22, 12:32 PM Lesle Chris, OTR/L [phone] 02/22/2016

## 2016-02-19 NOTE — Progress Notes (Signed)
Physical Therapy Treatment Patient Details Name: [name] Hamilton MRN: [id number] DOB: 25-Nov-1940 Today's Date: 02/19/2016    History of Present Illness 75 yo female s/p R THA-direct anterior 02/18/16. Hx of chronic L quad weakness.     PT Comments    Progressing well with mobility.   Follow Up Recommendations  Home health PT     Equipment Recommendations  None recommended by PT    Recommendations for Other Services       Precautions / Restrictions Precautions Precautions: Fall Restrictions Weight Bearing Restrictions: No RLE Weight Bearing: Weight bearing as tolerated    Mobility  Bed Mobility Overal bed mobility: Needs Assistance Bed Mobility: Supine to Sit;Sit to Supine     Supine to sit: Min assist Sit to supine: Min assist   General bed mobility comments: Assist for R LE  Transfers Overall transfer level: Needs assistance Equipment used: Rolling walker (2 wheeled) Transfers: Sit to/from Stand Sit to Stand: Min guard         General transfer comment: close guard for safety. VCs for hand placement  Ambulation/Gait Ambulation/Gait assistance: Min guard Ambulation Distance (Feet): 150 Feet Assistive device: Rolling walker (2 wheeled) Gait Pattern/deviations: Step-through pattern     General Gait Details: close guard for safety. slow gait speed. VCs safety, technique, sequence.    Stairs            Wheelchair Mobility    Modified Rankin (Stroke Patients Only)       Balance                                    Cognition Arousal/Alertness: Awake/alert Behavior During Therapy: WFL for tasks assessed/performed Overall Cognitive Status: Within Functional Limits for tasks assessed                      Exercises Total Joint Exercises Ankle Circles/Pumps: AROM;Both;10 reps;Supine Quad Sets: AROM;Both;Supine Heel Slides: AAROM;Right;10 reps;Supine Hip ABduction/ADduction: AAROM;Right;10 reps;Supine Long Arc Quad:  AAROM;Right;10 reps;Seated    General Comments        Pertinent Vitals/Pain Pain Assessment: 0-10 Pain Score: 5  Pain Location: R thigh Pain Descriptors / Indicators: Sore Pain Intervention(s): Monitored during session;Repositioned    Home Living Family/patient expects to be discharged to:: Private residence Living Arrangements: Spouse/significant other Available Help at Discharge: Family Type of Home: House Home Access: Stairs to enter Entrance Stairs-Rails: None Home Layout: Two level Home Equipment: Bedside commode      Prior Function Level of Independence: Independent          PT Goals (current goals can now be found in the care plan section) Acute Rehab PT Goals Patient Stated Goal: home tomorrow PT Goal Formulation: With patient/family Time For Goal Achievement: 02/26/16 Potential to Achieve Goals: Good Progress towards PT goals: Progressing toward goals    Frequency  7X/week    PT Plan Current plan remains appropriate    Co-evaluation             End of Session Equipment Utilized During Treatment: Gait belt Activity Tolerance: Patient tolerated treatment well Patient left: in bed;with call bell/phone within reach;with bed alarm set     Time: 1325-1355 PT Time Calculation (min) (ACUTE ONLY): 30 min  Charges:  $Gait Training: 8-22 mins $Therapeutic Exercise: 8-22 mins                    G Codes:  Weston Anna, MPT Pager: [phone]

## 2016-02-19 NOTE — Progress Notes (Signed)
Utilization review completed.  

## 2016-02-19 NOTE — Progress Notes (Signed)
     Subjective: 1 Day Post-Op Procedure(s) (LRB): RIGHT TOTAL HIP ARTHROPLASTY ANTERIOR APPROACH (Right)   Patient reports pain as mild, pain controlled.  No events throughout the night.  Has been having low BP, but not complaining of any symptoms.  Working well with PT.    Objective:   VITALS:   Filed Vitals:   02/19/16 0550 02/19/16 0606  BP: 105/46   Pulse: 62 65  Temp: 98.2 F (36.8 C)   Resp: 16 16    Dorsiflexion/Plantar flexion intact Incision: dressing C/D/I No cellulitis present Compartment soft  LABS  Recent Labs  02/19/16 0427  HGB 11.3*  HCT 34.1*  WBC 7.3  PLT 175     Recent Labs  02/19/16 0427  NA 135  K 3.6  BUN 13  CREATININE 0.73  GLUCOSE 168*     Assessment/Plan: 1 Day Post-Op Procedure(s) (LRB): RIGHT TOTAL HIP ARTHROPLASTY ANTERIOR APPROACH (Right) Foley cath d/c'ed Advance diet Up with therapy D/C IV fluids Discharge home with home health  Obese (BMI 30-39.9) Estimated body mass index is 30.6 kg/(m^2) as calculated from the following:   Height as of this encounter: 4' 11.5" (1.511 m).   Weight as of this encounter: 69.854 kg (154 lb). Patient also counseled that weight may inhibit the healing process Patient counseled that losing weight will help with future health issues      West Pugh. Jackston [place]   PAC  02/19/2016, 9:35 AM

## 2016-02-19 NOTE — Care Management Note (Signed)
Case Management Note  Patient Details  Name: [name] Hamilton MRN: [id number] Date of Birth: June 11, 1941  Subjective/Objective:                  RIGHT TOTAL HIP ARTHROPLASTY ANTERIOR APPROACH (Right) Action/Plan: Discharge planning Expected Discharge Date:  02/20/16               Expected Discharge Plan:  [place]  In-House Referral:     Discharge planning Services  CM Consult  Post Acute Care Choice:    Choice offered to:  Patient  DME Arranged:  N/A DME Agency:  NA  HH Arranged:  PT HH Agency:  [facility]  Status of Service:  Completed, signed off  Medicare Important Message Given:    Date Medicare IM Given:    Medicare IM give by:    Date Additional Medicare IM Given:    Additional Medicare Important Message give by:     If discussed at [place] of Stay Meetings, dates discussed:    Additional Comments: CM met with pt in room to offer choice of home health agency.  Pt chooses [facility] to render HHPT.  Referral given to [employer], Tim.  Pt has rolling walker and 3n1 from sx 3 years ago.  NO other CM needs were communicated. Dellie Catholic, RN 02/19/2016, 2:08 PM

## 2016-02-20 DIAGNOSIS — E669 Obesity, unspecified: Secondary | ICD-10-CM | POA: Diagnosis present

## 2016-02-20 LAB — CBC
HCT: 28.9 % — ABNORMAL LOW (ref 36.0–46.0)
HEMOGLOBIN: 9.6 g/dL — AB (ref 12.0–15.0)
MCH: 28.8 pg (ref 26.0–34.0)
MCHC: 33.2 g/dL (ref 30.0–36.0)
MCV: 86.8 fL (ref 78.0–100.0)
PLATELETS: 146 10*3/uL — AB (ref 150–400)
RBC: 3.33 MIL/uL — AB (ref 3.87–5.11)
RDW: 13.9 % (ref 11.5–15.5)
WBC: 7.6 10*3/uL (ref 4.0–10.5)

## 2016-02-20 LAB — BASIC METABOLIC PANEL
Anion gap: 5 (ref 5–15)
BUN: 15 mg/dL (ref 6–20)
CALCIUM: 8.3 mg/dL — AB (ref 8.9–10.3)
CHLORIDE: 104 mmol/L (ref 101–111)
CO2: 26 mmol/L (ref 22–32)
CREATININE: 0.74 mg/dL (ref 0.44–1.00)
GFR calc Af Amer: >60 mL/min (ref >60)
Glucose, Bld: 198 mg/dL — ABNORMAL HIGH (ref 65–99)
Potassium: 3.7 mmol/L (ref 3.5–5.1)
SODIUM: 135 mmol/L (ref 135–145)

## 2016-02-20 MED ORDER — DOCUSATE SODIUM 100 MG PO CAPS: 100.0000 mg | ORAL_CAPSULE | Freq: Two times a day (BID) | ORAL | Status: DC

## 2016-02-20 MED ORDER — POLYETHYLENE GLYCOL 3350 17 G PO PACK: 17.0000 g | PACK | Freq: Two times a day (BID) | ORAL | Status: DC

## 2016-02-20 MED ORDER — ACETAMINOPHEN 325 MG PO TABS: 650.0000 mg | ORAL_TABLET | Freq: Four times a day (QID) | ORAL | Status: DC | PRN

## 2016-02-20 MED ORDER — FERROUS SULFATE 325 (65 FE) MG PO TABS: 325.0000 mg | ORAL_TABLET | Freq: Three times a day (TID) | ORAL | Status: DC

## 2016-02-20 MED ORDER — ASPIRIN 325 MG PO TBEC: 325.0000 mg | DELAYED_RELEASE_TABLET | Freq: Two times a day (BID) | ORAL | Status: DC

## 2016-02-20 MED ORDER — ATENOLOL 50 MG PO TABS: ORAL_TABLET | ORAL | Status: AC

## 2016-02-20 MED ORDER — QUINAPRIL HCL 20 MG PO TABS: ORAL_TABLET | ORAL | Status: DC

## 2016-02-20 MED ORDER — TRAMADOL HCL 50 MG PO TABS
50.0000 mg | ORAL_TABLET | Freq: Four times a day (QID) | ORAL | Status: DC | PRN
Start: 2016-02-20 — End: 2017-01-18

## 2016-02-20 MED ORDER — ASPIRIN 325 MG PO TBEC: 325.0000 mg | DELAYED_RELEASE_TABLET | Freq: Two times a day (BID) | ORAL | Status: AC

## 2016-02-20 MED ORDER — TIZANIDINE HCL 4 MG PO TABS: 4.0000 mg | ORAL_TABLET | Freq: Four times a day (QID) | ORAL | Status: DC | PRN

## 2016-02-20 NOTE — Op Note (Addendum)
NAME:  [name] Hamilton NO.: [id number]      MEDICAL RECORD NO.: [id number]      FACILITY:  [facility]      PHYSICIAN:  Paralee Cancel D  DATE OF BIRTH:  [birth year]     DATE OF PROCEDURE:  02/18/2016                                 OPERATIVE REPORT         PREOPERATIVE DIAGNOSIS: Right  hip osteoarthritis.      POSTOPERATIVE DIAGNOSIS:  Right hip osteoarthritis.      PROCEDURE:  Right total hip replacement through an anterior approach   utilizing DePuy THR system, component size 54mm pinnacle cup, a size 32+4 neutral   Altrex liner, a size 2 Hi Tri Lock stem with a 32+1 delta ceramic   ball.      SURGEON:  Pietro Cassis. Alvan Dame, M.D.      ASSISTANT:  Danae Orleans, PA-C     ANESTHESIA:  Spinal.      SPECIMENS:  None.      COMPLICATIONS:  None.      BLOOD LOSS:  200 cc     DRAINS:  none.      INDICATION OF THE PROCEDURE:  [name] is a 75 y.o. female who had   presented to office for evaluation of right hip pain.  Radiographs revealed   progressive degenerative changes with bone-on-bone   articulation to the  hip joint.  The patient had painful limited range of   motion significantly affecting their overall quality of life.  The patient was failing to    respond to conservative measures, and at this point was ready   to proceed with more definitive measures.  The patient has noted progressive   degenerative changes in his hip, progressive problems and dysfunction   with regarding the hip prior to surgery.  Consent was obtained for   benefit of pain relief.  Specific risk of infection, DVT, component   failure, dislocation, need for revision surgery, as well discussion of   the anterior versus posterior approach were reviewed.  Consent was   obtained for benefit of anterior pain relief through an anterior   approach.      PROCEDURE IN DETAIL:  The patient was brought to operative theater.   Once adequate anesthesia,  preoperative antibiotics, 2gm of Ancef, 1 gm of Tranexamic Acid, and 10 mg of Decadron administered.   The patient was positioned supine on the OSI Hanna table.  Once adequate   padding of boney process was carried out, we had predraped out the hip, and  used fluoroscopy to confirm orientation of the pelvis and position.      The right hip was then prepped and draped from proximal iliac crest to   mid thigh with shower curtain technique.      Time-out was performed identifying the patient, planned procedure, and   extremity.     An incision was then made 2 cm distal and lateral to the   anterior superior iliac spine extending over the orientation of the   tensor fascia lata muscle and sharp dissection was carried down to the   fascia of the muscle and protractor placed in the soft tissues.      The fascia was then incised.  The muscle belly was identified and swept   laterally and retractor placed along the superior neck.  Following   cauterization of the circumflex vessels and removing some pericapsular   fat, a second cobra retractor was placed on the inferior neck.  A third   retractor was placed on the anterior acetabulum after elevating the   anterior rectus.  A L-capsulotomy was along the line of the   superior neck to the trochanteric fossa, then extended proximally and   distally.  Tag sutures were placed and the retractors were then placed   intracapsular.  We then identified the trochanteric fossa and   orientation of my neck cut, confirmed this radiographically   and then made a neck osteotomy with the femur on traction.  The femoral   head was removed without difficulty or complication.  Traction was let   off and retractors were placed posterior and anterior around the   acetabulum.      The labrum and foveal tissue were debrided.  I began reaming with a 76mm   reamer and reamed up to 44mm reamer with good bony bed preparation and a 58mm   cup was chosen.  The final 21mm  Pinnacle cup was then impacted under fluoroscopy  to confirm the depth of penetration and orientation with respect to   abduction.  A screw was placed followed by the hole eliminator.  The final   32+4 neutral Altrex liner was impacted with good visualized rim fit.  The cup was positioned anatomically within the acetabular portion of the pelvis.      At this point, the femur was rolled at 80 degrees.  Further capsule was   released off the inferior aspect of the femoral neck.  I then   released the superior capsule proximally.  The hook was placed laterally   along the femur and elevated manually and held in position with the bed   hook.  The leg was then extended and adducted with the leg rolled to 100   degrees of external rotation.  Once the proximal femur was fully   exposed, I used a box osteotome to set orientation.  I then began   broaching with the starting chili pepper broach and passed this by hand and then broached up to 2.  With the 2 broach in place I chose a high offset neck and did trial reductions.  The offset was appropriate, leg lengths   appeared to be equal best matched with the +1 head ball, confirmed radiographically.   Given these findings, I went ahead and dislocated the hip, repositioned all   retractors and positioned the right hip in the extended and abducted position.  The final 2 Hi Tri Lock stem was   chosen and it was impacted down to the level of neck cut.  Based on this   and the trial reduction, a 32+1 delta ceramic ball was chosen and   impacted onto a clean and dry trunnion, and the hip was reduced.  The   hip had been irrigated throughout the case again at this point.  I did   reapproximate the superior capsular leaflet to the anterior leaflet   using #1 Vicryl.  The fascia of the   tensor fascia lata muscle was then reapproximated using #1 Vicryl and # 0 V-lock sutures.  The   remaining wound was closed with 2-0 Vicryl and running 4-0 Monocryl.   The hip  was cleaned, dried, and dressed sterilely using  Dermabond and   Aquacel dressing.  She was then brought   to recovery room in stable condition tolerating the procedure well.    Danae Orleans, PA-C was present for the entirety of the case involved from   preoperative positioning, perioperative retractor management, general   facilitation of the case, as well as primary wound closure as assistant.            Pietro Cassis Alvan Dame, M.D.        02/20/2016 10:58 AM

## 2016-02-20 NOTE — Progress Notes (Signed)
Wasted 15 mg of oxycodone Neta Mends RN 02-20-16 4:05 PM

## 2016-02-20 NOTE — Progress Notes (Signed)
Physical Therapy Treatment Patient Details Name: [name] Hamilton MRN: [id number] DOB: 1941/04/02 Today's Date: 02/20/2016    History of Present Illness 75 yo female s/p R THA-direct anterior 02/18/16. Hx of chronic L quad weakness.     PT Comments    Progressing with mobility. Practiced/reviewed exercises, gait training, and stair training. All education completed. Ready to d/c from PT standpoint.   Follow Up Recommendations  Home health PT     Equipment Recommendations  None recommended by PT    Recommendations for Other Services       Precautions / Restrictions Precautions Precautions: Fall Restrictions Weight Bearing Restrictions: No RLE Weight Bearing: Weight bearing as tolerated    Mobility  Bed Mobility Overal bed mobility: Needs Assistance Bed Mobility: Supine to Sit     Supine to sit: Min assist     General bed mobility comments: Assist for R LE  Transfers Overall transfer level: Needs assistance Equipment used: Rolling walker (2 wheeled) Transfers: Sit to/from Stand Sit to Stand: Min guard         General transfer comment: close guard for safety. VCs for hand placement  Ambulation/Gait Ambulation/Gait assistance: Min guard Ambulation Distance (Feet): 125 Feet Assistive device: Rolling walker (2 wheeled) Gait Pattern/deviations: Step-to pattern;Step-through pattern;Decreased stride length     General Gait Details: close guard for safety. slow gait speed. VCs safety, technique, sequence.    Stairs Stairs: Yes   Stair Management: Step to pattern;Forwards;Sideways;Two rails;One rail Left Number of Stairs: 2 (x3) General stair comments: Practiced going up/down 2 steps x 3. Once with 2 rails, once with 1 rail (sideways), and once with 2 hand held assist. Assist to stabilize. VCs safety, technique, sequence.  Pt preferred sideways with 1 rail technique. Family present to observe as well.  Wheelchair Mobility    Modified Rankin (Stroke Patients  Only)       Balance                                    Cognition Arousal/Alertness: Awake/alert Behavior During Therapy: WFL for tasks assessed/performed Overall Cognitive Status: Within Functional Limits for tasks assessed                      Exercises Total Joint Exercises Ankle Circles/Pumps: AROM;Both;10 reps;Supine Quad Sets: AROM;Both;Supine Heel Slides: AAROM;Right;10 reps;Supine Hip ABduction/ADduction: AAROM;Right;10 reps;Supine Long Arc Quad: AAROM;Right;10 reps;Seated    General Comments        Pertinent Vitals/Pain Pain Assessment: 0-10 Pain Score: 5  Pain Location: R thigh with activity Pain Descriptors / Indicators: Sore;Spasm Pain Intervention(s): Monitored during session;Ice applied;Repositioned    Home Living                      Prior Function            PT Goals (current goals can now be found in the care plan section) Progress towards PT goals: Progressing toward goals    Frequency  7X/week    PT Plan Current plan remains appropriate    Co-evaluation             End of Session Equipment Utilized During Treatment: Gait belt Activity Tolerance: Patient tolerated treatment well Patient left: in bed;with call bell/phone within reach;with family/visitor present (sitting EOB)     Time: 1041-1110 PT Time Calculation (min) (ACUTE ONLY): 29 min  Charges:  $Gait Training: 23-37 mins  G Codes:      Weston Anna, MPT Pager: [phone]

## 2016-02-20 NOTE — Progress Notes (Signed)
Patient is alert and oriented, vital signs are stable, incision within normal limits, discharge instructions reviewed with patient and family, prescriptions given, patient to follow up with Alvan Dame and home health for physical therapy, questions and concerns answered Neta Mends RN 12:44 PM 02-20-2016

## 2016-02-20 NOTE — Progress Notes (Signed)
     Subjective: 2 Days Post-Op Procedure(s) (LRB): RIGHT TOTAL HIP ARTHROPLASTY ANTERIOR APPROACH (Right)   Patient reports pain as mild, pain controlled.  States that she did have 1 incident of muscle spasms last night but, otherwise no events. Discussed the dress, ASA, and analgesic medications.  Ready to be discharged home.   Objective:   VITALS:   Filed Vitals:   02/20/16 0200 02/20/16 0643  BP: 96/57 92/50  Pulse: 64 58  Temp: 98.4 F (36.9 C) 98.2 F (36.8 C)  Resp: 16 16    Dorsiflexion/Plantar flexion intact Incision: dressing C/D/I No cellulitis present Compartment soft  LABS  Recent Labs  02/19/16 0427 02/20/16 0359  HGB 11.3* 9.6*  HCT 34.1* 28.9*  WBC 7.3 7.6  PLT 175 146*     Recent Labs  02/19/16 0427 02/20/16 0359  NA 135 135  K 3.6 3.7  BUN 13 15  CREATININE 0.73 0.74  GLUCOSE 168* 198*     Assessment/Plan: 2 Days Post-Op Procedure(s) (LRB): RIGHT TOTAL HIP ARTHROPLASTY ANTERIOR APPROACH (Right) Up with therapy Discharge home Follow up in 2 weeks at [facility]. Follow up with OLIN,Eamonn Sermeno D in 2 weeks.  Contact information:  [facility] [address] [place] [name] Hamilton   PAC  02/20/2016, 8:14 AM

## 2016-02-20 NOTE — Discharge Instructions (Signed)

## 2016-02-25 NOTE — Discharge Summary (Signed)
Physician Discharge Summary  Patient ID: [name] Hamilton MRN: [id number] DOB/AGE: 73-May-1942 73 y.o.  Admit date: 02/18/2016 Discharge date: 02/20/2016   Procedures:  Procedure(s) (LRB): RIGHT TOTAL HIP ARTHROPLASTY ANTERIOR APPROACH (Right)  Attending Physician:  Dr. Paralee Cancel   Admission Diagnoses:   Right hip primary OA / pain  Discharge Diagnoses:  Principal Problem:   S/P right THA, AA Active Problems:   Obese  Past Medical History  Diagnosis Date  . HTN (hypertension)   . Hypercholesteremia   . Anxiety     depression at times  . UTI (urinary tract infection)     hx of  . Chest pain     Nuclear, 2005, normal  . AVM (arteriovenous malformation)        . Ejection fraction     EF 55-60%, echo, September, 2013  . Aortic valve sclerosis     Moderate calcification of the leaflets, echo, September, 2013,  no significant aortic stenosis.  . Mitral regurgitation     Moderate annular calcification, echo, September, 2013, mild  regurgitation  . Pneumonia 1967    hx of  . GERD (gastroesophageal reflux disease)   . Arthritis   . Hearing loss of left ear   . Anemia     with knee replacement  . Fractured coccyx ([place])     HPI:    [name] Hamilton, 75 y.o. female, has a history of pain and functional disability in the right hip(s) due to arthritis and patient has failed non-surgical conservative treatments for greater than 12 weeks to include NSAID's and/or analgesics, corticosteriod injections, use of assistive devices and activity modification. Onset of symptoms was gradual starting 1+ years ago with gradually worsening course since that time.The patient noted no past surgery on the right hip(s). Patient currently rates pain in the right hip at 10 out of 10 with activity. Patient has worsening of pain with activity and weight bearing, trendelenberg gait, pain that interfers with activities of daily living and pain with passive range of motion. Patient has evidence of  periarticular osteophytes and joint space narrowing by imaging studies. This condition presents safety issues increasing the risk of falls. There is no current active infection. Risks, benefits and expectations were discussed with the patient. Risks including but not limited to the risk of anesthesia, blood clots, nerve damage, blood vessel damage, failure of the prosthesis, infection and up to and including death. Patient understand the risks, benefits and expectations and wishes to proceed with surgery.   PCP: Tivis Ringer, MD   Discharged Condition: good  Hospital Course:  Patient underwent the above stated procedure on 02/18/2016. Patient tolerated the procedure well and brought to the recovery room in good condition and subsequently to the floor.  POD #1 BP: 105/46 ; Pulse: 65 ; Temp: 98.2 F (36.8 C) ; Resp: 16 Patient reports pain as mild, pain controlled. No events throughout the night. Has been having low BP, but not complaining of any symptoms. Working well with PT. Dorsiflexion/plantar flexion intact, incision: dressing C/D/I, no cellulitis present and compartment soft.   LABS  Basename    HGB     11.3  HCT     34.1   POD #2  BP: 92/50 ; Pulse: 58 ; Temp: 98.2 F (36.8 C) ; Resp: 16 Patient reports pain as mild, pain controlled. States that she did have 1 incident of muscle spasms last night but, otherwise no events. Discussed the dress, ASA, and analgesic medications. Also discussed the lower BP  annd for her to stay well hydrated and to hold her BP meds until her BP returns to over 120/80.  Ready to be discharged home.   LABS  Basename    HGB     9.6  HCT     28.9    Discharge Exam: General appearance: alert, cooperative and no distress Extremities: Homans sign is negative, no sign of DVT, no edema, redness or tenderness in the calves or thighs and no ulcers, gangrene or trophic changes  Disposition: Home with follow up in 2 weeks   Follow-up Information      Follow up with [facility].   Why:  home health physical therapy   Contact information:   [facility] [address] [place] [address] [phone]       Follow up with Mauri Pole, MD. Schedule an appointment as soon as possible for a visit in 2 weeks.   Specialty:  Orthopedic Surgery   Contact information:   [address] [place] [address] [id number]       Discharge Instructions    Call MD / Call 911    Complete by:  As directed   If you experience chest pain or shortness of breath, CALL 911 and be transported to the hospital emergency room.  If you develope a fever above 101 F, pus (white drainage) or increased drainage or redness at the wound, or calf pain, call your surgeon's office.     Change dressing    Complete by:  As directed   Maintain surgical dressing until follow up in the clinic. If the edges start to pull up, may reinforce with tape. If the dressing is no longer working, may remove and cover with gauze and tape, but must keep the area dry and clean.  Call with any questions or concerns.     Constipation Prevention    Complete by:  As directed   Drink plenty of fluids.  Prune juice may be helpful.  You may use a stool softener, such as Colace (over the counter) 100 mg twice a day.  Use MiraLax (over the counter) for constipation as needed.     Diet - low sodium heart healthy    Complete by:  As directed      Discharge instructions    Complete by:  As directed   Maintain surgical dressing until follow up in the clinic. If the edges start to pull up, may reinforce with tape. If the dressing is no longer working, may remove and cover with gauze and tape, but must keep the area dry and clean.  Follow up in 2 weeks at [facility]. Call with any questions or concerns.     Increase activity slowly as tolerated    Complete by:  As directed   Weight bearing as tolerated with assist device (walker, cane, etc) as directed, use  it as long as suggested by your surgeon or therapist, typically at least 4-6 weeks.     TED hose    Complete by:  As directed   Use stockings (TED hose) for 2 weeks on both leg(s).  You may remove them at night for sleeping.             Medication List    STOP taking these medications        ALEVE 220 MG Caps  Generic drug:  Naproxen Sodium     oxyCODONE-acetaminophen 5-325 MG tablet  Commonly known as:  PERCOCET/ROXICET  TAKE these medications        acetaminophen 325 MG tablet  Commonly known as:  TYLENOL  Take 2 tablets (650 mg total) by mouth every 6 (six) hours as needed for mild pain (or Fever >/= 101).     ALIGN 4 MG Caps  Take by mouth daily.     aspirin 325 MG EC tablet  Take 1 tablet (325 mg total) by mouth 2 (two) times daily.     atenolol 50 MG tablet  Commonly known as:  TENORMIN  Take 1 tablet by mouth  every morning.   HOLD if BP less then 120/80.     atorvastatin 20 MG tablet  Commonly known as:  LIPITOR  Take 1 tablet (20 mg total) by mouth at bedtime.     celecoxib 200 MG capsule  Commonly known as:  CELEBREX  Take 200 mg by mouth daily.     docusate sodium 100 MG capsule  Commonly known as:  COLACE  Take 1 capsule (100 mg total) by mouth 2 (two) times daily.     esomeprazole 20 MG capsule  Commonly known as:  NEXIUM  Take 20 mg by mouth daily as needed (For heartburn or acid reflux.).     ferrous sulfate 325 (65 FE) MG tablet  Take 1 tablet (325 mg total) by mouth 3 (three) times daily after meals.     polyethylene glycol packet  Commonly known as:  MIRALAX / GLYCOLAX  Take 17 g by mouth 2 (two) times daily.     quinapril 20 MG tablet  Commonly known as:  ACCUPRIL  Take 1 tablet by mouth  Daily.   HOLD if BP less than 120/80.     SYSTANE OP  Place 1 drop into both eyes daily as needed (For dry eyes.).     tiZANidine 4 MG tablet  Commonly known as:  ZANAFLEX  Take 1 tablet (4 mg total) by mouth every 6 (six) hours as needed  for muscle spasms.     traMADol 50 MG tablet  Commonly known as:  ULTRAM  Take 1-2 tablets (50-100 mg total) by mouth every 6 (six) hours as needed for moderate pain (1-2 tabs as needed).     triamterene-hydrochlorothiazide 37.5-25 MG tablet  Commonly known as:  MAXZIDE-25  Take 1 tablet by mouth  every morning     VITAMIN D-3 PO  Take 1 tablet by mouth daily.     zolpidem 10 MG tablet  Commonly known as:  AMBIEN  Take 5 mg by mouth at bedtime as needed for sleep.         Signed: [place] Pugh. Jujuan Dugo   PA-C  02/25/2016, 4:43 PM

## 2016-05-19 ENCOUNTER — Other Ambulatory Visit: Payer: Self-pay | Admitting: Cardiology

## 2016-07-23 ENCOUNTER — Telehealth: Payer: Self-pay | Admitting: Obstetrics & Gynecology

## 2016-07-23 NOTE — Telephone Encounter (Signed)
Opened in error. Closing encounter.

## 2016-09-10 ENCOUNTER — Encounter: Payer: Self-pay | Admitting: Obstetrics & Gynecology

## 2017-01-18 ENCOUNTER — Ambulatory Visit (INDEPENDENT_AMBULATORY_CARE_PROVIDER_SITE_OTHER): Payer: Medicare Other | Admitting: Obstetrics & Gynecology

## 2017-01-18 ENCOUNTER — Encounter: Payer: Self-pay | Admitting: Obstetrics & Gynecology

## 2017-01-18 VITALS — BP 128/70 | HR 68 | Resp 16 | Ht 59.0 in | Wt 152.0 lb

## 2017-01-18 DIAGNOSIS — Z01419 Encounter for gynecological examination (general) (routine) without abnormal findings: Secondary | ICD-10-CM | POA: Diagnosis not present

## 2017-01-18 NOTE — Progress Notes (Signed)
76 y.o. G3P2 Married [place] F here for annual exam.  Doing well.  Had right hip replaced 5/17.  Is basically pain free.  Not having abdominal pain any more.    Denies vaginal bleeding.    PCP:  Dr. Dagmar Hait.  Is seen every six months.  Has blood work scheduled.  No LMP recorded. Patient is postmenopausal.          Sexually active: Yes.    The current method of family planning is post menopausal status.    Exercising: No.  The patient does not participate in regular exercise at present. Smoker:  no  Health Maintenance: Pap:  01/03/16 Neg; 09/18/13 Neg History of abnormal Pap:  no MMG:  09/07/16 BIRADS 1: NEG Colonoscopy:  2012 Normal - repeat 10 years  BMD:    08/2014, osteopenia  TDaP:  2015 Pneumonia vaccine(s) and Zostavax:   Completed with [facility] Screening Labs: PCP   reports that she has never smoked. She has never used smokeless tobacco. She reports that she does not drink alcohol or use drugs.  Past Medical History:  Diagnosis Date  . Anemia    with knee replacement  . Anxiety    depression at times  . Aortic valve sclerosis    Moderate calcification of the leaflets, echo, September, 2013,  no significant aortic stenosis.  . Arthritis   . AVM (arteriovenous malformation)       . Chest pain    Nuclear, 2005, normal  . Ejection fraction    EF 55-60%, echo, September, 2013  . Fractured coccyx ([place])   . GERD (gastroesophageal reflux disease)   . Hearing loss of left ear   . HTN (hypertension)   . Hypercholesteremia   . Mitral regurgitation    Moderate annular calcification, echo, September, 2013, mild  regurgitation  . Pneumonia 1967   hx of  . UTI (urinary tract infection)    hx of    Past Surgical History:  Procedure Laterality Date  . CARDIAC CATHETERIZATION    . cataract surgery      bilateral   . CHOLECYSTECTOMY    . lumbar spinal stenosis surgery without any complications    . TENDON REPAIR Left 2013   in forarm  . TONSILLECTOMY    . TOTAL HIP ARTHROPLASTY  Right 02/18/2016   Procedure: RIGHT TOTAL HIP ARTHROPLASTY ANTERIOR APPROACH;  Surgeon: Paralee Cancel, MD;  Location: [facility];  Service: Orthopedics;  Laterality: Right;  . TOTAL KNEE ARTHROPLASTY Right 2010   Dr. Cay Schillings  . TOTAL KNEE ARTHROPLASTY Left 02/16/2013   Procedure: LEFT TOTAL KNEE ARTHROPLASTY;  Surgeon: Tobi Bastos, MD;  Location: [facility];  Service: Orthopedics;  Laterality: Left;    Current Outpatient Prescriptions  Medication Sig Dispense Refill  . acetaminophen (TYLENOL) 325 MG tablet Take 2 tablets (650 mg total) by mouth every 6 (six) hours as needed for mild pain (or Fever >/= 101).    Marland Kitchen atenolol (TENORMIN) 50 MG tablet Take 1 tablet by mouth  every morning.   HOLD if BP less then 120/80. 90 tablet 0  . atorvastatin (LIPITOR) 20 MG tablet Take 1 tablet (20 mg total) by mouth at bedtime. 90 tablet 3  . celecoxib (CELEBREX) 200 MG capsule Take 200 mg by mouth daily.    . Cholecalciferol (VITAMIN D-3 PO) Take 1 tablet by mouth daily.    Marland Kitchen esomeprazole (NEXIUM) 20 MG capsule Take 20 mg by mouth daily as needed (For heartburn or acid reflux.).     Marland Kitchen  fluticasone (FLONASE) 50 MCG/ACT nasal spray as needed.    . quinapril (ACCUPRIL) 20 MG tablet Take 1 tablet by mouth  Daily.   HOLD if BP less than 120/80. 90 tablet 0  . triamterene-hydrochlorothiazide (MAXZIDE-25) 37.5-25 MG tablet Take 1 tablet by mouth  every morning 90 tablet 0  . zolpidem (AMBIEN) 10 MG tablet Take 5 mg by mouth at bedtime as needed for sleep.      No current facility-administered medications for this visit.     Family History  Problem Relation Age of Onset  . Heart attack Father   . Cancer Mother     ROS:  Pertinent items are noted in HPI.  Otherwise, a comprehensive ROS was negative.  Exam:   BP 128/70 (BP Location: Right Arm, Patient Position: Sitting, Cuff Size: Large)   Pulse 68   Resp 16   Ht 4\' 11"  (1.499 m)   Wt 152 lb (68.9 kg)   BMI 30.70 kg/m   Weight change: -3#   Height: 4\' 11"   (149.9 cm)  Ht Readings from Last 3 Encounters:  01/18/17 4\' 11"  (1.499 m)  02/18/16 4' 11.5" (1.511 m)  02/11/16 4' 11.5" (1.511 m)    General appearance: alert, cooperative and appears stated age Head: Normocephalic, without obvious abnormality, atraumatic Neck: no adenopathy, supple, symmetrical, trachea midline and thyroid normal to inspection and palpation Lungs: clear to auscultation bilaterally Breasts: normal appearance, no masses or tenderness Heart: regular rate and rhythm Abdomen: soft, non-tender; bowel sounds normal; no masses,  no organomegaly Extremities: extremities normal, atraumatic, no cyanosis or edema Skin: Skin color, texture, turgor normal. No rashes or lesions Lymph nodes: Cervical, supraclavicular, and axillary nodes normal. No abnormal inguinal nodes palpated Neurologic: Grossly normal   Pelvic: External genitalia:  no lesions              Urethra:  normal appearing urethra with no masses, tenderness or lesions              Bartholins and Skenes: normal                 Vagina: normal appearing vagina with normal color and discharge, no lesions              Cervix: no lesions              Pap taken: No. Bimanual Exam:  Uterus:  normal size, contour, position, consistency, mobility, non-tender              Adnexa: normal adnexa and no mass, fullness, tenderness               Rectovaginal: Confirms               Anus:  normal sphincter tone, no lesions  Chaperone was present for exam.  A:  Well Woman with normal exam PMP, no HRT Hypertension Hyperlipidemia  P:   Mammogram guidelines  pap smear normal 4/17 Lab work done 7/17 with Dr. Dagmar Hait.  Has follow up scheduled for next month Return annually or prn

## 2017-02-24 ENCOUNTER — Other Ambulatory Visit: Payer: Self-pay | Admitting: Internal Medicine

## 2017-02-24 DIAGNOSIS — I671 Cerebral aneurysm, nonruptured: Secondary | ICD-10-CM

## 2017-03-02 ENCOUNTER — Ambulatory Visit
Admission: RE | Admit: 2017-03-02 | Discharge: 2017-03-02 | Disposition: A | Discharge status: Home / Self Care | Payer: Medicare Other | Source: Ambulatory Visit | Attending: Internal Medicine | Admitting: Internal Medicine

## 2017-03-02 DIAGNOSIS — I671 Cerebral aneurysm, nonruptured: Secondary | ICD-10-CM

## 2017-03-02 MED ORDER — IOPAMIDOL (ISOVUE-370) INJECTION 76%
80.0000 mL | Freq: Once | INTRAVENOUS | Status: AC | PRN
  Administered 2017-03-02: 80 mL via INTRAVENOUS

## 2017-03-22 ENCOUNTER — Other Ambulatory Visit: Payer: Self-pay | Admitting: Orthopedic Surgery

## 2017-03-22 DIAGNOSIS — M25511 Pain in right shoulder: Secondary | ICD-10-CM

## 2017-04-16 ENCOUNTER — Ambulatory Visit: Payer: Medicare Other | Admitting: Obstetrics & Gynecology

## 2017-04-26 ENCOUNTER — Ambulatory Visit: Payer: Medicare Other | Admitting: Obstetrics & Gynecology

## 2017-11-05 ENCOUNTER — Other Ambulatory Visit: Payer: Self-pay | Admitting: Orthopedic Surgery

## 2017-11-09 NOTE — Pre-Procedure Instructions (Signed)
[name] Hamilton  11/09/2017      [place], [place] [facility] [address] [place] [address] Phone: [phone] Fax: [phone]  [facility] [place] - [address] [place] [address] Phone: [phone] Fax: [phone]  [name] Hamilton [facility] [address] [place] - [place] [address] Phone: [phone] Fax: [phone]    Your procedure is scheduled on February 26  Report to [place] at [place].M.  Call this number if you have problems the morning of surgery:  [phone]   Remember:  Do not eat food or drink liquids after midnight.  Continue all medications as directed by your physician except follow these medication instructions before surgery below   Take these medicines the morning of surgery with A SIP OF WATER  acetaminophen (TYLENOL) esomeprazole (NEXIUM) fluticasone (FLONASE)    7 days prior to surgery STOP taking any Aspirin(unless otherwise instructed by your surgeon), Aleve, Naproxen, Ibuprofen, Motrin, Advil, Goody's, BC's, all herbal medications, fish oil, and all vitamins, celecoxib (CELEBREX)    Do not wear jewelry, make-up or nail polish.  Do not wear lotions, powders, or perfumes, or deodorant.  Do not shave 48 hours prior to surgery.   Do not bring valuables to the hospital.  [facility] is not responsible for any belongings or valuables.  Contacts, dentures or bridgework may not be worn into surgery.  Leave your suitcase in the car.  After surgery it may be brought to your room.  For patients admitted to the hospital, discharge time will be determined by your treatment team.  Patients discharged the day of surgery will not be allowed to drive home.    Special instructions:   [place]- Preparing For Surgery  Before surgery, you can play an important role. Because skin  is not sterile, your skin needs to be as free of germs as possible. You can reduce the number of germs on your skin by washing with CHG (chlorahexidine gluconate) Soap before surgery.  CHG is an antiseptic cleaner which kills germs and bonds with the skin to continue killing germs even after washing.  Please do not use if you have an allergy to CHG or antibacterial soaps. If your skin becomes reddened/irritated stop using the CHG.  Do not shave (including legs and underarms) for at least 48 hours prior to first CHG shower. It is OK to shave your face.  Please follow these instructions carefully.   1. Shower the NIGHT BEFORE SURGERY and the MORNING OF SURGERY with CHG.   2. If you chose to wash your hair, wash your hair first as usual with your normal shampoo.  3. After you shampoo, rinse your hair and body thoroughly to remove the shampoo.  4. Use CHG as you would any other liquid soap. You can apply CHG directly to the skin and wash gently with a scrungie or a clean washcloth.   5. Apply the CHG Soap to your body ONLY FROM THE NECK DOWN.  Do not use on open wounds or open sores. Avoid contact with your eyes, ears, mouth and genitals (private parts). Wash Face and genitals (private parts)  with your normal soap.  6. Wash thoroughly, paying special attention to the area where your surgery will be performed.  7. Thoroughly rinse your body with warm water from the neck down.  8. DO  NOT shower/wash with your normal soap after using and rinsing off the CHG Soap.  9. Pat yourself dry with a CLEAN TOWEL.  10. Wear CLEAN PAJAMAS to bed the night before surgery, wear comfortable clothes the morning of surgery  11. Place CLEAN SHEETS on your bed the night of your first shower and DO NOT SLEEP WITH PETS.    Day of Surgery: Do not apply any deodorants/lotions. Please wear clean clothes to the hospital/surgery center.      Please read over the following fact sheets that you were  given.

## 2017-11-10 ENCOUNTER — Encounter (HOSPITAL_COMMUNITY)
Admission: RE | Admit: 2017-11-10 | Discharge: 2017-11-10 | Disposition: A | Discharge status: Home / Self Care | Payer: Medicare Other | Source: Ambulatory Visit | Attending: Orthopedic Surgery | Admitting: Orthopedic Surgery

## 2017-11-10 ENCOUNTER — Encounter (HOSPITAL_COMMUNITY): Payer: Self-pay

## 2017-11-10 ENCOUNTER — Other Ambulatory Visit: Payer: Self-pay

## 2017-11-10 DIAGNOSIS — Z01812 Encounter for preprocedural laboratory examination: Secondary | ICD-10-CM | POA: Insufficient documentation

## 2017-11-10 DIAGNOSIS — I10 Essential (primary) hypertension: Secondary | ICD-10-CM | POA: Insufficient documentation

## 2017-11-10 DIAGNOSIS — Z01810 Encounter for preprocedural cardiovascular examination: Secondary | ICD-10-CM | POA: Insufficient documentation

## 2017-11-10 HISTORY — DX: Personal history of other diseases of the digestive system: Z87.19

## 2017-11-10 LAB — BASIC METABOLIC PANEL
Anion gap: 12 (ref 5–15)
BUN: 18 mg/dL (ref 6–20)
CALCIUM: 10.2 mg/dL (ref 8.9–10.3)
CO2: 24 mmol/L (ref 22–32)
CREATININE: 0.99 mg/dL (ref 0.44–1.00)
Chloride: 103 mmol/L (ref 101–111)
GFR calc Af Amer: >60 mL/min (ref >60)
GFR, EST NON AFRICAN AMERICAN: 55 mL/min — AB (ref >60)
GLUCOSE: 106 mg/dL — AB (ref 65–99)
Potassium: 3.8 mmol/L (ref 3.5–5.1)
Sodium: 139 mmol/L (ref 135–145)

## 2017-11-10 LAB — SURGICAL PCR SCREEN
MRSA, PCR: NEGATIVE
STAPHYLOCOCCUS AUREUS: NEGATIVE

## 2017-11-10 LAB — CBC
HCT: 40.6 % (ref 36.0–46.0)
HEMOGLOBIN: 13.2 g/dL (ref 12.0–15.0)
MCH: 29.7 pg (ref 26.0–34.0)
MCHC: 32.5 g/dL (ref 30.0–36.0)
MCV: 91.4 fL (ref 78.0–100.0)
PLATELETS: 193 10*3/uL (ref 150–400)
RBC: 4.44 MIL/uL (ref 3.87–5.11)
RDW: 13.4 % (ref 11.5–15.5)
WBC: 5.3 10*3/uL (ref 4.0–10.5)

## 2017-11-10 NOTE — Progress Notes (Signed)
PCP - Dr. Dagmar Hait Cardiologist - Dr. Einar Gip  Chest x-ray - n/a EKG - 11/10/2017 Stress Test - patient believes she had one in 2017 prior to knee surgery.  Requesting records from Dr. Irven Shelling office ECHO - patient believes she had one in 2017 prior to knee surgery.  Requesting records from Dr. Irven Shelling office Cardiac Cath - patient denies  Sleep Study - patient denies  Anesthesia review: yes, cardiac history  Patient denies shortness of breath, fever, cough and chest pain at PAT appointment   Patient verbalized understanding of instructions that were given to them at the PAT appointment. Patient was also instructed that they will need to review over the PAT instructions again at home before surgery.

## 2017-11-10 NOTE — Pre-Procedure Instructions (Signed)
Nuvia Hileman  11/10/2017      [place], [place] [facility] [address] [place] [address] Phone: [phone] Fax: [phone]  [facility] [place], [place] - [address] [place] [address] Phone: [phone] Fax: [phone]  [facility] [address] [place] - [place] [address] Phone: [phone] Fax: [phone]    Your procedure is scheduled on February 26  Report to [place] at [place].M.  Call this number if you have problems the morning of surgery:  [phone]   Remember:  Do not eat food or drink liquids after midnight.  Continue all medications as directed by your physician except follow these medication instructions before surgery below   Take these medicines the morning of surgery with A SIP OF WATER  Claritin - if needed  7 days prior to surgery STOP taking any Aspirin(unless otherwise instructed by your surgeon), Aleve, Naproxen, Ibuprofen, Motrin, Advil, Goody's, BC's, all herbal medications, fish oil, and all vitamins   Do not wear jewelry, make-up or nail polish.  Do not wear lotions, powders, or perfumes, or deodorant.  Do not shave 48 hours prior to surgery.   Do not bring valuables to the hospital.  [facility] is not responsible for any belongings or valuables.  Contacts, dentures or partials may not be worn into surgery.  Leave your suitcase in the car.  After surgery it may be brought to your room.  For patients admitted to the hospital, discharge time will be determined by your treatment team.  Patients discharged the day of surgery will not be allowed to drive home.    Special instructions:   - Preparing For Surgery  Before surgery, you can play an important role. Because skin is not sterile, your skin needs to be as free of germs as possible. You can  reduce the number of germs on your skin by washing with CHG (chlorahexidine gluconate) Soap before surgery.  CHG is an antiseptic cleaner which kills germs and bonds with the skin to continue killing germs even after washing.  Please do not use if you have an allergy to CHG or antibacterial soaps. If your skin becomes reddened/irritated stop using the CHG.  Do not shave (including legs and underarms) for at least 48 hours prior to first CHG shower. It is OK to shave your face.  Please follow these instructions carefully.   1. Shower the NIGHT BEFORE SURGERY and the MORNING OF SURGERY with CHG.   2. If you chose to wash your hair, wash your hair first as usual with your normal shampoo.  3. After you shampoo, rinse your hair and body thoroughly to remove the shampoo.  4. Use CHG as you would any other liquid soap. You can apply CHG directly to the skin and wash gently with a scrungie or a clean washcloth.   5. Apply the CHG Soap to your body ONLY FROM THE NECK DOWN.  Do not use on open wounds or open sores. Avoid contact with your eyes, ears, mouth and genitals (private parts). Wash Face and genitals (private parts)  with your normal soap.  6. Wash thoroughly, paying special attention to the area where your surgery will be performed.  7. Thoroughly rinse your body with warm water from the neck down.  8. DO NOT shower/wash with your normal soap after  using and rinsing off the CHG Soap.  9. Pat yourself dry with a CLEAN TOWEL.  10. Wear CLEAN PAJAMAS to bed the night before surgery, wear comfortable clothes the morning of surgery  11. Place CLEAN SHEETS on your bed the night of your first shower and DO NOT SLEEP WITH PETS.    Day of Surgery: Shower as stated above. Do not apply any deodorants/lotions. Please wear clean clothes to the hospital/surgery center.      Please read over the following fact sheets that you were given.

## 2017-11-11 NOTE — Progress Notes (Addendum)
Anesthesia Chart Review: Patient is a 77 year old female scheduled for right reverse total shoulder arthroplasty on 11/23/17 by Dr. Marchia Bond.  History includes never smoker, HTN, hypercholesterolemia, anxiety, GERD, hiatal hernia, arteriovenous malformation, hearing loss (left), right THA 02/18/16, left TKA 02/16/13, right TKA 12/26/08, cholecystectomy, chest pain '05 (negative stress test '05, '17), L4-S1 laminectomies/L5-S1 foraminotomies 08/11/01. She had very mild AS and mild to moderate MR by 11/2015 echo. She had 50-60% right carotid bulb stenosis by 02/2017 CTA.  - PCP is Dr. Prince Solian at [facility]. He signed a note of medical clearance for surgery. - Cardiologist is Dr. Adrian Prows at [facility]. Last visti 01/15/17. He signed a note of preoperative clearance with permission to hold ASA prior to surgery.   Meds include ASA 81 mg, atenolol, Lipitor, Claritin, olmesartan, quinapril, triamterene-HCTZ, Ambien.  BP 131/84   Pulse 69   Temp 36.8 C   Resp 20   Ht 4\' 11"  (1.499 m)   Wt 152 lb 4.8 oz (69.1 kg)   SpO2 100%   BMI 30.76 kg/m    EKG 11/10/17: NSR.  Nuclear stress test 01/27/16 [facility] CV; scanned under [place] tab, Correspondence 02/18/16): Impression: 1.  Resting EKG demonstrated normal sinus rhythm, normal axis.  Low voltage complexes, poor R wave progression.  Stress EKG is nondiagnostic for ischemia as it is a pharmacologic stress test.  Stress symptoms included dizziness. 2.  Myocardial perfusion imaging is normal.  Overall left ventricular systolic function was normal without regional wall motion abnormalities.  The left ventricular ejection fraction was 57%.  Echo 12/20/15 [facility] CV; scanned under [place] tab, Correspondence 02/18/16): Conclusion: 1.  Left ventricle cavity is normal in size.  No wall motion abnormalities.  Low normal LVEF of 50-55%.  Doppler evidence of grade 2 (pseudo-normal) diastolic dysfunction.  Presence of mitral annular  calcification may give false diastolic values.  Diastolic dysfunction findings suggest elevated LA/LV end-diastolic pressure. 2.  Left atrial cavity is normal in size.  Inter-atrial septum bulges to the right suggest elevated left atrial pressure. 3.  Mild calcification of the aortic valve annulus.  Mild aortic valve leaflet calcification.  Mildly restricted aortic valve leaflets.  Mild aortic valve stenosis.  Aortic valve peak pressure gradient of 18 and mean gradient of 9 mmHg, consistent with very mild aortic stenosis.  Calculated aortic valve area 1.02 cm.  Trace aortic regurgitation. 4.  Mild calcification of the mitral valve annulus.  Mild to moderate mitral regurgitation. 5.  Mild tricuspid regurgitation.  No evidence of pulmonary hypertension. 6.  Mild pulmonic regurgitation.  CTA head/neck 03/02/17 (ordered by Dr. Dagmar Hait): IMPRESSION: 1. CTA head and neck is negative for aneurysm. 2.  Normal for age CT appearance of the brain. 3. There is atherosclerosis resulting in mild to moderate stenosis at the right ICA bulb (50-60%), the right vertebral artery origin, and the left vertebral artery V4 segment. 4. No other extra- or intracranial stenosis. Mild generalized arterial tortuosity.  Preoperative labs noted. Cr 0.99. Glucose 106. CBC WNL.   Patient has medical and cardiac clearance for surgery. If no acute changes then I anticipate that she can proceed as planned.  [facility] Short Stay Center/Anesthesiology Phone [phone] 11/12/2017 10:53 AM

## 2017-11-12 ENCOUNTER — Encounter (HOSPITAL_COMMUNITY): Payer: Self-pay

## 2017-11-23 ENCOUNTER — Inpatient Hospital Stay (HOSPITAL_COMMUNITY): Payer: Medicare Other | Admitting: Anesthesiology

## 2017-11-23 ENCOUNTER — Inpatient Hospital Stay (HOSPITAL_COMMUNITY): Payer: Medicare Other | Admitting: Vascular Surgery

## 2017-11-23 ENCOUNTER — Inpatient Hospital Stay (HOSPITAL_COMMUNITY): Payer: Medicare Other

## 2017-11-23 ENCOUNTER — Encounter (HOSPITAL_COMMUNITY)
Admission: RE | Disposition: A | Discharge status: Home / Self Care | Payer: Self-pay | Source: Ambulatory Visit | Attending: Orthopedic Surgery

## 2017-11-23 ENCOUNTER — Encounter (HOSPITAL_COMMUNITY): Payer: Self-pay | Admitting: Urology

## 2017-11-23 ENCOUNTER — Inpatient Hospital Stay (HOSPITAL_COMMUNITY)
Admission: RE | Admit: 2017-11-23 | Discharge: 2017-11-24 | DRG: 483 | Disposition: A | Discharge status: Home / Self Care | Payer: Medicare Other | Source: Ambulatory Visit | Attending: Orthopedic Surgery | Admitting: Orthopedic Surgery

## 2017-11-23 DIAGNOSIS — I080 Rheumatic disorders of both mitral and aortic valves: Secondary | ICD-10-CM | POA: Diagnosis present

## 2017-11-23 DIAGNOSIS — Z9841 Cataract extraction status, right eye: Secondary | ICD-10-CM

## 2017-11-23 DIAGNOSIS — I10 Essential (primary) hypertension: Secondary | ICD-10-CM | POA: Diagnosis present

## 2017-11-23 DIAGNOSIS — M19019 Primary osteoarthritis, unspecified shoulder: Secondary | ICD-10-CM | POA: Diagnosis present

## 2017-11-23 DIAGNOSIS — E7800 Pure hypercholesterolemia, unspecified: Secondary | ICD-10-CM | POA: Diagnosis present

## 2017-11-23 DIAGNOSIS — Z809 Family history of malignant neoplasm, unspecified: Secondary | ICD-10-CM

## 2017-11-23 DIAGNOSIS — K219 Gastro-esophageal reflux disease without esophagitis: Secondary | ICD-10-CM | POA: Diagnosis present

## 2017-11-23 DIAGNOSIS — M19011 Primary osteoarthritis, right shoulder: Secondary | ICD-10-CM | POA: Diagnosis present

## 2017-11-23 DIAGNOSIS — K449 Diaphragmatic hernia without obstruction or gangrene: Secondary | ICD-10-CM | POA: Diagnosis present

## 2017-11-23 DIAGNOSIS — Z96619 Presence of unspecified artificial shoulder joint: Secondary | ICD-10-CM

## 2017-11-23 DIAGNOSIS — Z8249 Family history of ischemic heart disease and other diseases of the circulatory system: Secondary | ICD-10-CM | POA: Diagnosis not present

## 2017-11-23 DIAGNOSIS — Z9842 Cataract extraction status, left eye: Secondary | ICD-10-CM | POA: Diagnosis not present

## 2017-11-23 DIAGNOSIS — Z96653 Presence of artificial knee joint, bilateral: Secondary | ICD-10-CM | POA: Diagnosis present

## 2017-11-23 DIAGNOSIS — Z7982 Long term (current) use of aspirin: Secondary | ICD-10-CM | POA: Diagnosis not present

## 2017-11-23 DIAGNOSIS — M12811 Other specific arthropathies, not elsewhere classified, right shoulder: Secondary | ICD-10-CM | POA: Diagnosis present

## 2017-11-23 DIAGNOSIS — H9192 Unspecified hearing loss, left ear: Secondary | ICD-10-CM | POA: Diagnosis present

## 2017-11-23 DIAGNOSIS — Z91048 Other nonmedicinal substance allergy status: Secondary | ICD-10-CM | POA: Diagnosis not present

## 2017-11-23 DIAGNOSIS — Z96641 Presence of right artificial hip joint: Secondary | ICD-10-CM | POA: Diagnosis present

## 2017-11-23 DIAGNOSIS — M75101 Unspecified rotator cuff tear or rupture of right shoulder, not specified as traumatic: Secondary | ICD-10-CM

## 2017-11-23 DIAGNOSIS — M25511 Pain in right shoulder: Secondary | ICD-10-CM | POA: Diagnosis present

## 2017-11-23 HISTORY — PX: REVERSE SHOULDER ARTHROPLASTY: SHX5054

## 2017-11-23 SURGERY — ARTHROPLASTY, SHOULDER, TOTAL, REVERSE
Anesthesia: General | Site: Shoulder | Laterality: Right

## 2017-11-23 MED ORDER — ONDANSETRON HCL 4 MG/2ML IJ SOLN
INTRAMUSCULAR | Status: AC
  Filled 2017-11-23: qty 2

## 2017-11-23 MED ORDER — DIPHENHYDRAMINE HCL 12.5 MG/5ML PO ELIX: 12.5000 mg | ORAL_SOLUTION | ORAL | Status: DC | PRN

## 2017-11-23 MED ORDER — PROPOFOL 10 MG/ML IV BOLUS
INTRAVENOUS | Status: AC
  Filled 2017-11-23: qty 20

## 2017-11-23 MED ORDER — SENNA 8.6 MG PO TABS
1.0000 | ORAL_TABLET | Freq: Two times a day (BID) | ORAL | Status: DC
  Administered 2017-11-23 (×2): 8.6 mg via ORAL
  Filled 2017-11-23 (×2): qty 1

## 2017-11-23 MED ORDER — POLYETHYLENE GLYCOL 3350 17 G PO PACK: 17.0000 g | PACK | Freq: Every day | ORAL | Status: DC | PRN

## 2017-11-23 MED ORDER — IRBESARTAN 300 MG PO TABS
300.0000 mg | ORAL_TABLET | Freq: Every day | ORAL | Status: DC
  Filled 2017-11-23 (×2): qty 1

## 2017-11-23 MED ORDER — DEXAMETHASONE SODIUM PHOSPHATE 10 MG/ML IJ SOLN
INTRAMUSCULAR | Status: AC
  Filled 2017-11-23: qty 1

## 2017-11-23 MED ORDER — MEPERIDINE HCL 50 MG/ML IJ SOLN: 6.2500 mg | INTRAMUSCULAR | Status: DC | PRN

## 2017-11-23 MED ORDER — DEXAMETHASONE SODIUM PHOSPHATE 10 MG/ML IJ SOLN
INTRAMUSCULAR | Status: DC | PRN
  Administered 2017-11-23: 10 mg via INTRAVENOUS

## 2017-11-23 MED ORDER — PHENOL 1.4 % MT LIQD: 1.0000 | OROMUCOSAL | Status: DC | PRN

## 2017-11-23 MED ORDER — BUPIVACAINE HCL (PF) 0.25 % IJ SOLN
INTRAMUSCULAR | Status: AC
  Filled 2017-11-23: qty 30

## 2017-11-23 MED ORDER — TRIAMTERENE-HCTZ 37.5-25 MG PO TABS
1.0000 | ORAL_TABLET | Freq: Every morning | ORAL | Status: DC
  Filled 2017-11-23 (×2): qty 1

## 2017-11-23 MED ORDER — CEFAZOLIN SODIUM-DEXTROSE 2-4 GM/100ML-% IV SOLN
2.0000 g | INTRAVENOUS | Status: AC
  Administered 2017-11-23: 2 g via INTRAVENOUS

## 2017-11-23 MED ORDER — CEFAZOLIN SODIUM-DEXTROSE 2-4 GM/100ML-% IV SOLN
INTRAVENOUS | Status: AC
  Filled 2017-11-23: qty 100

## 2017-11-23 MED ORDER — ATORVASTATIN CALCIUM 20 MG PO TABS
20.0000 mg | ORAL_TABLET | Freq: Every day | ORAL | Status: DC
  Administered 2017-11-23: 20 mg via ORAL
  Filled 2017-11-23: qty 1

## 2017-11-23 MED ORDER — METOCLOPRAMIDE HCL 5 MG PO TABS: 5.0000 mg | ORAL_TABLET | Freq: Three times a day (TID) | ORAL | Status: DC | PRN

## 2017-11-23 MED ORDER — ACETAMINOPHEN 500 MG PO TABS
1000.0000 mg | ORAL_TABLET | Freq: Four times a day (QID) | ORAL | Status: DC
  Administered 2017-11-23 – 2017-11-24 (×3): 1000 mg via ORAL
  Filled 2017-11-23 (×4): qty 2

## 2017-11-23 MED ORDER — METHOCARBAMOL 1000 MG/10ML IJ SOLN
500.0000 mg | Freq: Four times a day (QID) | INTRAVENOUS | Status: DC | PRN
  Filled 2017-11-23: qty 5

## 2017-11-23 MED ORDER — PHENYLEPHRINE HCL 10 MG/ML IJ SOLN
INTRAVENOUS | Status: DC | PRN
  Administered 2017-11-23: 80 ug/min via INTRAVENOUS

## 2017-11-23 MED ORDER — CHLORHEXIDINE GLUCONATE 4 % EX LIQD: 60.0000 mL | Freq: Once | CUTANEOUS | Status: DC

## 2017-11-23 MED ORDER — DOCUSATE SODIUM 100 MG PO CAPS
100.0000 mg | ORAL_CAPSULE | Freq: Two times a day (BID) | ORAL | Status: DC
  Administered 2017-11-23 (×2): 100 mg via ORAL
  Filled 2017-11-23 (×2): qty 1

## 2017-11-23 MED ORDER — HYDROMORPHONE HCL 1 MG/ML IJ SOLN: 0.5000 mg | INTRAMUSCULAR | Status: DC | PRN

## 2017-11-23 MED ORDER — 0.9 % SODIUM CHLORIDE (POUR BTL) OPTIME
TOPICAL | Status: DC | PRN
  Administered 2017-11-23: 1000 mL

## 2017-11-23 MED ORDER — EPHEDRINE 5 MG/ML INJ
INTRAVENOUS | Status: AC
  Filled 2017-11-23: qty 10

## 2017-11-23 MED ORDER — EPHEDRINE SULFATE 50 MG/ML IJ SOLN
INTRAMUSCULAR | Status: DC | PRN
  Administered 2017-11-23 (×3): 5 mg via INTRAVENOUS

## 2017-11-23 MED ORDER — ACETAMINOPHEN 650 MG RE SUPP
650.0000 mg | RECTAL | Status: DC | PRN
Start: 2017-11-24 — End: 2017-11-24

## 2017-11-23 MED ORDER — MIDAZOLAM HCL 2 MG/2ML IJ SOLN
INTRAMUSCULAR | Status: AC
  Filled 2017-11-23: qty 2

## 2017-11-23 MED ORDER — PHENYLEPHRINE 40 MCG/ML (10ML) SYRINGE FOR IV PUSH (FOR BLOOD PRESSURE SUPPORT)
PREFILLED_SYRINGE | INTRAVENOUS | Status: AC
  Filled 2017-11-23: qty 10

## 2017-11-23 MED ORDER — SODIUM CHLORIDE 0.9 % IR SOLN
Status: DC | PRN
  Administered 2017-11-23: 1

## 2017-11-23 MED ORDER — METHOCARBAMOL 500 MG PO TABS: 500.0000 mg | ORAL_TABLET | Freq: Four times a day (QID) | ORAL | Status: DC | PRN

## 2017-11-23 MED ORDER — METOCLOPRAMIDE HCL 5 MG/ML IJ SOLN: 5.0000 mg | Freq: Three times a day (TID) | INTRAMUSCULAR | Status: DC | PRN

## 2017-11-23 MED ORDER — HYDROCODONE-ACETAMINOPHEN 10-325 MG PO TABS
2.0000 | ORAL_TABLET | ORAL | Status: DC | PRN
  Administered 2017-11-23: 2 via ORAL
  Filled 2017-11-23: qty 2

## 2017-11-23 MED ORDER — SENNA-DOCUSATE SODIUM 8.6-50 MG PO TABS: 2.0000 | ORAL_TABLET | Freq: Every day | ORAL | 1 refills | Status: DC

## 2017-11-23 MED ORDER — ATENOLOL 50 MG PO TABS
50.0000 mg | ORAL_TABLET | Freq: Every day | ORAL | Status: DC
  Administered 2017-11-23: 50 mg via ORAL
  Filled 2017-11-23: qty 1

## 2017-11-23 MED ORDER — FENTANYL CITRATE (PF) 250 MCG/5ML IJ SOLN
INTRAMUSCULAR | Status: AC
  Filled 2017-11-23: qty 5

## 2017-11-23 MED ORDER — ONDANSETRON HCL 4 MG/2ML IJ SOLN
INTRAMUSCULAR | Status: DC | PRN
  Administered 2017-11-23: 4 mg via INTRAVENOUS

## 2017-11-23 MED ORDER — ROCURONIUM BROMIDE 100 MG/10ML IV SOLN
INTRAVENOUS | Status: DC | PRN
  Administered 2017-11-23: 70 mg via INTRAVENOUS

## 2017-11-23 MED ORDER — BACLOFEN 10 MG PO TABS: 10.0000 mg | ORAL_TABLET | Freq: Three times a day (TID) | ORAL | 0 refills | Status: DC

## 2017-11-23 MED ORDER — ASPIRIN EC 81 MG PO TBEC: 81.0000 mg | DELAYED_RELEASE_TABLET | Freq: Every day | ORAL | Status: DC

## 2017-11-23 MED ORDER — ROPIVACAINE HCL 7.5 MG/ML IJ SOLN
INTRAMUSCULAR | Status: DC | PRN
  Administered 2017-11-23: 20 mL via PERINEURAL

## 2017-11-23 MED ORDER — ALUM & MAG HYDROXIDE-SIMETH 200-200-20 MG/5ML PO SUSP
30.0000 mL | ORAL | Status: DC | PRN
  Administered 2017-11-24: 30 mL via ORAL
  Filled 2017-11-23: qty 30

## 2017-11-23 MED ORDER — KETOROLAC TROMETHAMINE 15 MG/ML IJ SOLN
7.5000 mg | Freq: Four times a day (QID) | INTRAMUSCULAR | Status: AC
  Administered 2017-11-23 – 2017-11-24 (×4): 7.5 mg via INTRAVENOUS
  Filled 2017-11-23 (×4): qty 1

## 2017-11-23 MED ORDER — MIDAZOLAM HCL 5 MG/5ML IJ SOLN
INTRAMUSCULAR | Status: DC | PRN
  Administered 2017-11-23: 2 mg via INTRAVENOUS

## 2017-11-23 MED ORDER — ZOLPIDEM TARTRATE 5 MG PO TABS: 5.0000 mg | ORAL_TABLET | Freq: Every evening | ORAL | Status: DC | PRN

## 2017-11-23 MED ORDER — FENTANYL CITRATE (PF) 100 MCG/2ML IJ SOLN
INTRAMUSCULAR | Status: DC | PRN
  Administered 2017-11-23 (×2): 50 ug via INTRAVENOUS

## 2017-11-23 MED ORDER — METOCLOPRAMIDE HCL 5 MG/ML IJ SOLN: 10.0000 mg | Freq: Once | INTRAMUSCULAR | Status: DC | PRN

## 2017-11-23 MED ORDER — ACETAMINOPHEN 325 MG PO TABS
650.0000 mg | ORAL_TABLET | ORAL | Status: DC | PRN
Start: 2017-11-24 — End: 2017-11-24

## 2017-11-23 MED ORDER — PROPOFOL 1000 MG/100ML IV EMUL
INTRAVENOUS | Status: AC
  Filled 2017-11-23: qty 100

## 2017-11-23 MED ORDER — LORATADINE 10 MG PO TABS: 10.0000 mg | ORAL_TABLET | Freq: Every day | ORAL | Status: DC | PRN

## 2017-11-23 MED ORDER — CEFAZOLIN SODIUM-DEXTROSE 1-4 GM/50ML-% IV SOLN
1.0000 g | Freq: Four times a day (QID) | INTRAVENOUS | Status: AC
  Administered 2017-11-23 – 2017-11-24 (×3): 1 g via INTRAVENOUS
  Filled 2017-11-23 (×3): qty 50

## 2017-11-23 MED ORDER — POTASSIUM CHLORIDE IN NACL 20-0.45 MEQ/L-% IV SOLN
INTRAVENOUS | Status: DC
  Filled 2017-11-23: qty 1000

## 2017-11-23 MED ORDER — ROCURONIUM BROMIDE 10 MG/ML (PF) SYRINGE
PREFILLED_SYRINGE | INTRAVENOUS | Status: AC
  Filled 2017-11-23: qty 5

## 2017-11-23 MED ORDER — PHENYLEPHRINE HCL 10 MG/ML IJ SOLN
INTRAMUSCULAR | Status: DC | PRN
  Administered 2017-11-23: 120 ug via INTRAVENOUS
  Administered 2017-11-23 (×2): 80 ug via INTRAVENOUS
  Administered 2017-11-23: 40 ug via INTRAVENOUS

## 2017-11-23 MED ORDER — DIPHENHYDRAMINE HCL 50 MG/ML IJ SOLN
INTRAMUSCULAR | Status: AC
  Filled 2017-11-23: qty 1

## 2017-11-23 MED ORDER — SUGAMMADEX SODIUM 200 MG/2ML IV SOLN
INTRAVENOUS | Status: AC
  Filled 2017-11-23: qty 4

## 2017-11-23 MED ORDER — PROPOFOL 10 MG/ML IV BOLUS
INTRAVENOUS | Status: DC | PRN
  Administered 2017-11-23: 150 mg via INTRAVENOUS

## 2017-11-23 MED ORDER — BISACODYL 10 MG RE SUPP: 10.0000 mg | Freq: Every day | RECTAL | Status: DC | PRN

## 2017-11-23 MED ORDER — HYDROCODONE-ACETAMINOPHEN 10-325 MG PO TABS: 1.0000 | ORAL_TABLET | Freq: Four times a day (QID) | ORAL | 0 refills | Status: DC | PRN

## 2017-11-23 MED ORDER — ZOLPIDEM TARTRATE 5 MG PO TABS
5.0000 mg | ORAL_TABLET | Freq: Every evening | ORAL | Status: DC | PRN
  Administered 2017-11-23: 5 mg via ORAL
  Filled 2017-11-23: qty 1

## 2017-11-23 MED ORDER — ONDANSETRON HCL 4 MG PO TABS: 4.0000 mg | ORAL_TABLET | Freq: Three times a day (TID) | ORAL | 0 refills | Status: DC | PRN

## 2017-11-23 MED ORDER — FENTANYL CITRATE (PF) 100 MCG/2ML IJ SOLN: 25.0000 ug | INTRAMUSCULAR | Status: DC | PRN

## 2017-11-23 MED ORDER — GLYCOPYRROLATE 0.2 MG/ML IJ SOLN
INTRAMUSCULAR | Status: DC | PRN
  Administered 2017-11-23: 0.2 mg via INTRAVENOUS

## 2017-11-23 MED ORDER — MENTHOL 3 MG MT LOZG
1.0000 | LOZENGE | OROMUCOSAL | Status: DC | PRN
  Filled 2017-11-23: qty 9

## 2017-11-23 MED ORDER — SUGAMMADEX SODIUM 200 MG/2ML IV SOLN
INTRAVENOUS | Status: DC | PRN
  Administered 2017-11-23: 200 mg via INTRAVENOUS

## 2017-11-23 MED ORDER — MAGNESIUM CITRATE PO SOLN: 1.0000 | Freq: Once | ORAL | Status: DC | PRN

## 2017-11-23 MED ORDER — LACTATED RINGERS IV SOLN
INTRAVENOUS | Status: DC | PRN
  Administered 2017-11-23: 07:00:00 via INTRAVENOUS

## 2017-11-23 MED ORDER — ONDANSETRON HCL 4 MG PO TABS: 4.0000 mg | ORAL_TABLET | Freq: Four times a day (QID) | ORAL | Status: DC | PRN

## 2017-11-23 MED ORDER — HYDROCODONE-ACETAMINOPHEN 10-325 MG PO TABS
1.0000 | ORAL_TABLET | ORAL | Status: DC | PRN
  Administered 2017-11-23: 1 via ORAL
  Filled 2017-11-23: qty 1

## 2017-11-23 MED ORDER — ONDANSETRON HCL 4 MG/2ML IJ SOLN: 4.0000 mg | Freq: Four times a day (QID) | INTRAMUSCULAR | Status: DC | PRN

## 2017-11-23 SURGICAL SUPPLY — 73 items
APL SKNCLS STERI-STRIP NONHPOA (GAUZE/BANDAGES/DRESSINGS) ×1
BENZOIN TINCTURE PRP APPL 2/3 (GAUZE/BANDAGES/DRESSINGS) ×3 IMPLANT
BIT DRILL F/CENTRAL SCRW 3.2 (BIT)
BIT DRILL F/CENTRAL SCRW 3.2MM (BIT) IMPLANT
BIT DRILL TWIST 2.7 (BIT) IMPLANT
BIT DRILL TWIST 2.7MM (BIT)
BLADE SAW SAG 73X25 THK (BLADE) ×2
BLADE SAW SGTL 73X25 THK (BLADE) ×1 IMPLANT
BOOTCOVER CLEANROOM LRG (PROTECTIVE WEAR) ×6 IMPLANT
BOWL SMART MIX CTS (DISPOSABLE) IMPLANT
BRUSH FEMORAL CANAL (MISCELLANEOUS) IMPLANT
CAPT SHLDR REVTOTAL 2 ×3 IMPLANT
CAPT SHOULDER REVTOTAL 2 ×1 IMPLANT
CLOSURE STERI-STRIP 1/2X4 (GAUZE/BANDAGES/DRESSINGS) ×1
CLOSURE WOUND 1/2 X4 (GAUZE/BANDAGES/DRESSINGS) ×1
CLSR STERI-STRIP ANTIMIC 1/2X4 (GAUZE/BANDAGES/DRESSINGS) ×2 IMPLANT
COVER BACK TABLE 60X90IN (DRAPES) ×3 IMPLANT
COVER SURGICAL LIGHT HANDLE (MISCELLANEOUS) ×3 IMPLANT
DRAPE C-ARM 42X72 X-RAY (DRAPES) IMPLANT
DRAPE ORTHO SPLIT 77X108 STRL (DRAPES) ×6
DRAPE SURG ORHT 6 SPLT 77X108 (DRAPES) ×2 IMPLANT
DRAPE U-SHAPE 47X51 STRL (DRAPES) ×3 IMPLANT
DRILL BIT F/CENTRAL SCRW 3.2MM (BIT)
DRSG MEPILEX BORDER 4X8 (GAUZE/BANDAGES/DRESSINGS) ×2 IMPLANT
DURAPREP 26ML APPLICATOR (WOUND CARE) ×3 IMPLANT
ELECT REM PT RETURN 9FT ADLT (ELECTROSURGICAL) ×3
ELECTRODE REM PT RTRN 9FT ADLT (ELECTROSURGICAL) ×1 IMPLANT
EVACUATOR 1/8 PVC DRAIN (DRAIN) IMPLANT
FACESHIELD WRAPAROUND (MASK) IMPLANT
FACESHIELD WRAPAROUND OR TEAM (MASK) IMPLANT
GLOVE BIOGEL PI IND STRL 8 (GLOVE) ×1 IMPLANT
GLOVE BIOGEL PI INDICATOR 8 (GLOVE) ×2
GLOVE BIOGEL PI ORTHO PRO SZ8 (GLOVE) ×2
GLOVE ORTHO TXT STRL SZ7.5 (GLOVE) ×3 IMPLANT
GLOVE PI ORTHO PRO STRL SZ8 (GLOVE) ×1 IMPLANT
GLOVE SURG ORTHO 8.0 STRL STRW (GLOVE) ×6 IMPLANT
GOWN STRL REUS W/ TWL XL LVL3 (GOWN DISPOSABLE) ×1 IMPLANT
GOWN STRL REUS W/TWL 2XL LVL3 (GOWN DISPOSABLE) ×3 IMPLANT
GOWN STRL REUS W/TWL XL LVL3 (GOWN DISPOSABLE) ×3
HANDPIECE INTERPULSE COAX TIP (DISPOSABLE)
HOOD PEEL AWAY FACE SHEILD DIS (HOOD) ×6 IMPLANT
KIT BASIN OR (CUSTOM PROCEDURE TRAY) ×3 IMPLANT
KIT ROOM TURNOVER OR (KITS) ×3 IMPLANT
MANIFOLD NEPTUNE II (INSTRUMENTS) ×3 IMPLANT
NS IRRIG 1000ML POUR BTL (IV SOLUTION) ×3 IMPLANT
PACK SHOULDER (CUSTOM PROCEDURE TRAY) ×3 IMPLANT
PAD ARMBOARD 7.5X6 YLW CONV (MISCELLANEOUS) ×6 IMPLANT
PIN STEINMANN THREADED TIP (PIN) IMPLANT
PIN THREADED REVERSE (PIN) IMPLANT
SET HNDPC FAN SPRY TIP SCT (DISPOSABLE) IMPLANT
SHOULDER CAPITATED REVTOTAL 2 IMPLANT
SLING ARM IMMOBILIZER LRG (SOFTGOODS) IMPLANT
SLING ARM IMMOBILIZER MED (SOFTGOODS) ×2 IMPLANT
SMARTMIX MINI TOWER (MISCELLANEOUS)
SPONGE LAP 18X18 X RAY DECT (DISPOSABLE) ×3 IMPLANT
STRIP CLOSURE SKIN 1/2X4 (GAUZE/BANDAGES/DRESSINGS) ×1 IMPLANT
SUCTION FRAZIER HANDLE 10FR (MISCELLANEOUS) ×2
SUCTION TUBE FRAZIER 10FR DISP (MISCELLANEOUS) ×1 IMPLANT
SUPPORT WRAP ARM LG (MISCELLANEOUS) ×3 IMPLANT
SUT ETHIBOND 2 0 SH (SUTURE)
SUT ETHIBOND 2 0 SH 36X2 (SUTURE) IMPLANT
SUT ETHIBOND 2 OS 4 DA (SUTURE) IMPLANT
SUT ETHIBOND CT1 BRD 2-0 30IN (SUTURE) IMPLANT
SUT FIBERWIRE #2 38 T-5 BLUE (SUTURE)
SUT VIC AB 2-0 CT1 27 (SUTURE) ×3
SUT VIC AB 2-0 CT1 TAPERPNT 27 (SUTURE) ×1 IMPLANT
SUT VIC AB 3-0 SH 8-18 (SUTURE) ×6 IMPLANT
SUTURE FIBERWR #2 38 T-5 BLUE (SUTURE) IMPLANT
TOWEL OR 17X24 6PK STRL BLUE (TOWEL DISPOSABLE) ×3 IMPLANT
TOWEL OR 17X26 10 PK STRL BLUE (TOWEL DISPOSABLE) ×3 IMPLANT
TOWER SMARTMIX MINI (MISCELLANEOUS) IMPLANT
TRAY FOLEY W/METER SILVER 16FR (SET/KITS/TRAYS/PACK) IMPLANT
WATER STERILE IRR 1000ML POUR (IV SOLUTION) ×3 IMPLANT

## 2017-11-23 NOTE — H&P (Signed)
PREOPERATIVE H&P  Chief Complaint: Rotator cuff arthropathy RIGHT SHOULDER  HPI: [name] Hamilton is a 77 y.o. female who presents for preoperative history and physical with a diagnosis of rotator cuff arthropathy RIGHT SHOULDER. Symptoms are rated as moderate to severe, and have been worsening.  This is significantly impairing activities of daily living.  She has elected for surgical management.   She has failed injections, activity modification, anti-inflammatories, and assistive devices.  Preoperative X-rays demonstrate end stage degenerative changes, loss of joint space, subchondral sclerosis.   Past Medical History:  Diagnosis Date  . Anemia    with knee replacement  . Anxiety    depression at times  . Aortic valve sclerosis    mild AV calcification, very mild AS (peak grad 18, mean grad 9, AVA 1.02 cm) 11/2015 [facility] CV)  . Arthritis   . AVM (arteriovenous malformation)       . Chest pain    Nuclear, 2005, normal  . Ejection fraction    EF 55-60%, echo, September, 2013  . Fractured coccyx ([place])   . GERD (gastroesophageal reflux disease)   . Hearing loss of left ear   . History of hiatal hernia    "mild one"  . HTN (hypertension)   . Hypercholesteremia   . Mitral regurgitation    mild-mod MR 11/2015 echo [facility] CV)  . Pneumonia 1967   hx of  . UTI (urinary tract infection)    hx of   Past Surgical History:  Procedure Laterality Date  . cataract surgery      bilateral   . CHOLECYSTECTOMY    . lumbar spinal stenosis surgery without any complications    . TENDON REPAIR Left 2013   in forarm  . TONSILLECTOMY    . TOTAL HIP ARTHROPLASTY Right 02/18/2016   Procedure: RIGHT TOTAL HIP ARTHROPLASTY ANTERIOR APPROACH;  Surgeon: Paralee Cancel, MD;  Location: [facility] OR[facility];  Service: Orthopedics;  Laterality: Right;  . TOTAL KNEE ARTHROPLASTY Right 2010   Dr. Cay Schillings  . TOTAL KNEE ARTHROPLASTY Left 02/16/2013   Procedure: LEFT TOTAL KNEE ARTHROPLASTY;  Surgeon: Tobi Bastos, MD;  Location: [facility] ORS;  Service: Orthopedics;  Laterality: Left;   Social History   Socioeconomic History  . Marital status: Married    Spouse name: None  . Number of children: 2  . Years of education: None  . Highest education level: None  Social Needs  . Financial resource strain: None  . Food insecurity - worry: None  . Food insecurity - inability: None  . Transportation needs - medical: None  . Transportation needs - non-medical: None  Occupational History  . None  Tobacco Use  . Smoking status: Never Smoker  . Smokeless tobacco: Never Used  Substance and Sexual Activity  . Alcohol use: No    Alcohol/week: 0.0 oz    Comment: socially  . Drug use: No  . Sexual activity: Yes    Partners: Male    Birth control/protection: Post-menopausal  Other Topics Concern  . None  Social History Narrative  . None   Family History  Problem Relation Age of Onset  . Heart attack Father   . Cancer Mother    Allergies  Allergen Reactions  . Adhesive [Tape] Rash  . Latex Rash   Prior to Admission medications   Medication Sig Start Date End Date Taking? Authorizing Provider  aspirin EC 81 MG tablet Take 81 mg by mouth daily with supper.   Yes [provider]  atenolol (TENORMIN)  50 MG tablet Take 1 tablet by mouth  every morning.   HOLD if BP less then 120/80. Patient taking differently: Take 50 mg by mouth at bedtime. HOLD if BP less then 120/80. 02/20/16  Yes Babish, Rodman Key, PA-C  atorvastatin (LIPITOR) 20 MG tablet Take 1 tablet (20 mg total) by mouth at bedtime. Patient taking differently: Take 20 mg by mouth daily with supper.  11/05/14  Yes Carlena Bjornstad, MD  Cholecalciferol (VITAMIN D-3 PO) Take 1 tablet by mouth daily.   Yes [provider]  loratadine (CLARITIN) 10 MG tablet Take 10 mg by mouth daily as needed for allergies.   Yes [provider]  olmesartan (BENICAR) 40 MG tablet Take 40 mg by mouth every morning.    Yes [provider]  triamterene-hydrochlorothiazide (MAXZIDE-25) 37.5-25 MG tablet Take 1 tablet by mouth  every morning 10/30/15  Yes Carlena Bjornstad, MD  zolpidem (AMBIEN) 10 MG tablet Take 5 mg by mouth at bedtime as needed for sleep.  06/03/12  Yes [provider]  quinapril (ACCUPRIL) 20 MG tablet Take 1 tablet by mouth  Daily.   HOLD if BP less than 120/80. Patient not taking: Reported on 11/10/2017 02/20/16   Danae Orleans, PA-C     Positive ROS: All other systems have been reviewed and were otherwise negative with the exception of those mentioned in the HPI and as above.  Physical Exam: General: Alert, no acute distress Cardiovascular: No pedal edema Respiratory: No cyanosis, no use of accessory musculature GI: No organomegaly, abdomen is soft and non-tender Skin: No lesions in the area of chief complaint Neurologic: Sensation intact distally Psychiatric: Patient is competent for consent with normal mood and affect Lymphatic: No axillary or cervical lymphadenopathy  MUSCULOSKELETAL: Right shoulder active motion 0-40 degrees external rotation to 10 degrees cuff strength is very weak positive drop arm.  Assessment: DJD RIGHT SHOULDER   Plan: Plan for Procedure(s): TOTAL REVERSE SHOULDER ARTHROPLASTY  The risks benefits and alternatives were discussed with the patient including but not limited to the risks of nonoperative treatment, versus surgical intervention including infection, bleeding, nerve injury,  blood clots, cardiopulmonary complications, morbidity, mortality, among others, and they were willing to proceed.   Severity of Illness: Inpatient only procedure   Johnny Bridge, MD Cell [phone]   11/23/2017 7:17 AM

## 2017-11-23 NOTE — Discharge Instructions (Signed)

## 2017-11-23 NOTE — Plan of Care (Signed)
  Progressing Pain Management: Pain level will decrease with appropriate interventions 11/23/2017 2053 - Progressing by Charlena Cross, RN

## 2017-11-23 NOTE — Evaluation (Signed)
Physical Therapy Evaluation Patient Details Name: [name] Hamilton MRN: [id number] DOB: 1941/08/12 Today's Date: 11/23/2017   History of Present Illness  77 y.o. female admitted s/p R reverse total shoulder surgery (11/23/17). PMH includes but not limited to HTN, hypercholesterolemia, mitral regurgitation, AVM, and anxiety.   Clinical Impression  Pt presents to physical therapy secondary to above. Pt supervision for all functional mobility at this time. Educated pt regarding protection of R UE, sling use, bed mobility, transfers, and stair negotiation without use of R UE. Pt anxious about stairs, so reviewed multiple stair negotiation methods with patient and family based on pt's stair set up at home. No further acute PT follow up necessary at this time. Defer to OT for all d/c recommendations.      Follow Up Recommendations No PT follow up    Equipment Recommendations  None recommended by PT    Recommendations for Other Services OT consult     Precautions / Restrictions Precautions Precautions: Fall;Shoulder Type of Shoulder Precautions: defer to OT Shoulder Interventions: Shoulder sling/immobilizer Precaution Booklet Issued: No Required Braces or Orthoses: Sling Restrictions Weight Bearing Restrictions: No      Mobility  Bed Mobility Overal bed mobility: Modified Independent             General bed mobility comments: increased time and use of bed rail  Transfers Overall transfer level: Modified independent Equipment used: None             General transfer comment: sit<>stand  Ambulation/Gait Ambulation/Gait assistance: Supervision Ambulation Distance (Feet): 400 Feet Assistive device: 2 person hand held assist;None Gait Pattern/deviations: Step-through pattern;Decreased stride length Gait velocity: decreased   General Gait Details: HHA for first 150 ft for pt comfort, quickly progressed to no AD with supervision for slight instability likely from recent  anesthesia, no LOB noted  Stairs Stairs: Yes Stairs assistance: Supervision Stair Management: Sideways;One rail Right;Step to pattern Number of Stairs: 5 General stair comments: also educated pt and husband on how to assist pt on stairs HHA   Wheelchair Mobility    Modified Rankin (Stroke Patients Only)       Balance Overall balance assessment: Needs assistance Sitting-balance support: Feet supported;No upper extremity supported Sitting balance-Leahy Scale: Good     Standing balance support: During functional activity;No upper extremity supported Standing balance-Leahy Scale: Fair Standing balance comment: able to perform static standing and ambulate unsupported without LOB, very mild instability likely due to recent anesthesia during surgery                             Pertinent Vitals/Pain Pain Assessment: No/denies pain    Home Living Family/patient expects to be discharged to:: Private residence Living Arrangements: Spouse/significant other Available Help at Discharge: Family;Available 24 hours/day Type of Home: House Home Access: Stairs to enter Entrance Stairs-Rails: Right Entrance Stairs-Number of Steps: 4-5 Home Layout: Multi-level;Able to live on main level with bedroom/bathroom Home Equipment: None      Prior Function Level of Independence: Needs assistance   Gait / Transfers Assistance Needed: Independent  ADL's / Homemaking Assistance Needed: assistance from husband due to R shoulder problems        Hand Dominance   Dominant Hand: Right    Extremity/Trunk Assessment   Upper Extremity Assessment Upper Extremity Assessment: Defer to OT evaluation    Lower Extremity Assessment Lower Extremity Assessment: Overall WFL for tasks assessed(hx bil TKA and R THA)  Communication   Communication: No difficulties  Cognition Arousal/Alertness: Awake/alert Behavior During Therapy: WFL for tasks assessed/performed Overall Cognitive  Status: Within Functional Limits for tasks assessed                                        General Comments General comments (skin integrity, edema, etc.): pt's husband and children present.     Exercises     Assessment/Plan    PT Assessment Patent does not need any further PT services(defer to OT for d/c recommendations)  PT Problem List         PT Treatment Interventions      PT Goals (Current goals can be found in the Care Plan section)  Acute Rehab PT Goals Patient Stated Goal: to get better PT Goal Formulation: With patient/family Time For Goal Achievement: 12/07/17 Potential to Achieve Goals: Good    Frequency     Barriers to discharge        Co-evaluation               AM-PAC PT "6 Clicks" Daily Activity  Outcome Measure Difficulty turning over in bed (including adjusting bedclothes, sheets and blankets)?: None Difficulty moving from lying on back to sitting on the side of the bed? : A Little Difficulty sitting down on and standing up from a chair with arms (e.g., wheelchair, bedside commode, etc,.)?: None Help needed moving to and from a bed to chair (including a wheelchair)?: A Little Help needed walking in hospital room?: None Help needed climbing 3-5 steps with a railing? : A Little 6 Click Score: 21    End of Session Equipment Utilized During Treatment: Gait belt(sling) Activity Tolerance: Patient tolerated treatment well Patient left: in bed;with call bell/phone within reach;with family/visitor present Nurse Communication: Mobility status PT Visit Diagnosis: Unsteadiness on feet (R26.81)    Time: 2878-6767 PT Time Calculation (min) (ACUTE ONLY): 34 min   Charges:   PT Evaluation $PT Eval Low Complexity: 1 Low PT Treatments $Self Care/Home Management: 8-22   PT G Codes:        Vic Ripper, SPT  Vic Ripper 11/23/2017, 4:11 PM

## 2017-11-23 NOTE — Anesthesia Preprocedure Evaluation (Signed)
Anesthesia Evaluation  Patient identified by MRN, date of birth, ID band Patient awake    Reviewed: Allergy & Precautions, NPO status , Patient's Chart, lab work & pertinent test results  Airway Mallampati: II  TM Distance: >3 FB Neck ROM: Full    Dental no notable dental hx.    Pulmonary neg pulmonary ROS,    Pulmonary exam normal breath sounds clear to auscultation       Cardiovascular hypertension, Normal cardiovascular exam+ Valvular Problems/Murmurs MR and AS  Rhythm:Regular Rate:Normal     Neuro/Psych negative neurological ROS  negative psych ROS   GI/Hepatic Neg liver ROS, hiatal hernia, GERD  ,  Endo/Other  negative endocrine ROS  Renal/GU negative Renal ROS  negative genitourinary   Musculoskeletal negative musculoskeletal ROS (+)   Abdominal   Peds negative pediatric ROS (+)  Hematology negative hematology ROS (+)   Anesthesia Other Findings   Reproductive/Obstetrics negative OB ROS                             Anesthesia Physical Anesthesia Plan  ASA: I  Anesthesia Plan: General   Post-op Pain Management:  Regional for Post-op pain   Induction: Intravenous  PONV Risk Score and Plan: 3 and Ondansetron, Treatment may vary due to age or medical condition and Midazolam  Airway Management Planned: Oral ETT  Additional Equipment:   Intra-op Plan:   Post-operative Plan: Extubation in OR  Informed Consent: I have reviewed the patients History and Physical, chart, labs and discussed the procedure including the risks, benefits and alternatives for the proposed anesthesia with the patient or authorized representative who has indicated his/her understanding and acceptance.   Dental advisory given  Plan Discussed with: CRNA  Anesthesia Plan Comments:         Anesthesia Quick Evaluation

## 2017-11-23 NOTE — Anesthesia Procedure Notes (Signed)
Anesthesia Regional Block: Supraclavicular block   Pre-Anesthetic Checklist: ,, timeout performed, Correct Patient, Correct Site, Correct Laterality, Correct Procedure, Correct Position, site marked, Risks and benefits discussed,  Surgical consent,  Pre-op evaluation,  At surgeon's request and post-op pain management  Laterality: Right and Upper  Prep: Maximum Sterile Barrier Precautions used, chloraprep       Needles:  Injection technique: Single-shot  Needle Type: Echogenic Stimulator Needle     Needle Length: 10cm      Additional Needles:   Procedures:,,,, ultrasound used (permanent image in chart),,,,  Narrative:  Start time: 11/23/2017 6:53 AM End time: 11/23/2017 7:03 AM Injection made incrementally with aspirations every 5 mL.  Performed by: Personally  Anesthesiologist: Montez Hageman, MD  Additional Notes: Risks, benefits and alternative to block explained extensively.  Patient tolerated procedure well, without complications.

## 2017-11-23 NOTE — Transfer of Care (Signed)
Immediate Anesthesia Transfer of Care Note  Patient: [name] Hamilton  Procedure(s) Performed: TOTAL REVERSE SHOULDER ARTHROPLASTY (Right Shoulder)  Patient Location: PACU  Anesthesia Type:GA combined with regional for post-op pain  Level of Consciousness: awake, alert , oriented and patient cooperative  Airway & Oxygen Therapy: Patient Spontanous Breathing and Patient connected to nasal cannula oxygen  Post-op Assessment: Report given to RN and Post -op Vital signs reviewed and stable  Post vital signs: Reviewed and stable  Last Vitals:  Vitals:   11/23/17 0610 11/23/17 0611  BP:  105/72  Pulse: 67   Resp: 18   Temp: (!) 36.4 C   SpO2: 99%     Last Pain:  Vitals:   11/23/17 0610  TempSrc: Oral         Complications: No apparent anesthesia complications

## 2017-11-23 NOTE — Anesthesia Procedure Notes (Signed)
Procedure Name: Intubation Performed by: Adalberto Ill, CRNA Pre-anesthesia Checklist: Patient identified, Emergency Drugs available, Suction available and Patient being monitored Patient Re-evaluated:Patient Re-evaluated prior to induction Oxygen Delivery Method: Circle System Utilized Preoxygenation: Pre-oxygenation with 100% oxygen Induction Type: IV induction Ventilation: Mask ventilation without difficulty Laryngoscope Size: Mac and 3 Grade View: Grade II Tube type: Oral Tube size: 7.0 mm Number of attempts: 1 Airway Equipment and Method: Stylet and Oral airway Placement Confirmation: ETT inserted through vocal cords under direct vision,  positive ETCO2 and breath sounds checked- equal and bilateral Secured at: 20 cm Tube secured with: Tape Dental Injury: Teeth and Oropharynx as per pre-operative assessment

## 2017-11-23 NOTE — Anesthesia Postprocedure Evaluation (Signed)
Anesthesia Post Note  Patient: Journalist, newspaper  Procedure(s) Performed: TOTAL REVERSE SHOULDER ARTHROPLASTY (Right Shoulder)     Patient location during evaluation: PACU Anesthesia Type: General Level of consciousness: awake and alert Pain management: pain level controlled Vital Signs Assessment: post-procedure vital signs reviewed and stable Respiratory status: spontaneous breathing, nonlabored ventilation, respiratory function stable and patient connected to nasal cannula oxygen Cardiovascular status: blood pressure returned to baseline and stable Postop Assessment: no apparent nausea or vomiting Anesthetic complications: no    Last Vitals:  Vitals:   11/23/17 1115 11/23/17 1157  BP: 95/65 96/67  Pulse: (!) 58 67  Resp: 15 18  Temp: (!) 36.4 C   SpO2: 100% 95%    Last Pain:  Vitals:   11/23/17 1115  TempSrc:   PainSc: 0-No pain                 Montez Hageman

## 2017-11-23 NOTE — Op Note (Signed)
11/23/2017  10:15 AM  PATIENT:  [name] Hamilton    PRE-OPERATIVE DIAGNOSIS:  RIGHT SHOULDER rotator cuff arthropathy  POST-OPERATIVE DIAGNOSIS:  Same  PROCEDURE: Right reverse shoulder arthroplasty  SURGEON:  Johnny Bridge, MD  PHYSICIAN ASSISTANT: Joya Gaskins, OPA-C, present and scrubbed throughout the case, critical for completion in a timely fashion, and for retraction, instrumentation, and closure.  ANESTHESIA:   General with interscalene block  ESTIMATED BLOOD LOSS: 150 mL  PREOPERATIVE INDICATIONS:  [name] is a  77 y.o. female with a diagnosis of rotator cuff arthropathy RIGHT SHOULDER who failed conservative measures and elected for surgical management.    The risks benefits and alternatives were discussed with the patient preoperatively including but not limited to the risks of infection, bleeding, nerve injury, cardiopulmonary complications, the need for revision surgery, dislocation, brachial plexus palsy, incomplete relief of pain, among others, and the patient was willing to proceed.  OPERATIVE IMPLANTS: Biomet size 7 humeral stem press-fit standard with a 44 mm reverse shoulder arthroplasty tray with a standard liner and a 36 mm glenosphere with a mini baseplate and 4 locking screws and one central nonlocking screw.  OPERATIVE FINDINGS: As predicted, her bone size is extremely small.  The glenoid still had some cartilage on it.  The subscapularis was present, the supraspinatus and infraspinatus were badly torn with severe retraction.  OPERATIVE PROCEDURE: The patient was brought to the operating room and placed in the supine position. General anesthesia was administered. IV antibiotics were given.  Time out was performed. The upper extremity was prepped and draped in usual sterile fashion. The patient was in a beachchair position. Deltopectoral approach was carried out. The biceps was tenodesed to the pectoralis tendon with #2 Fiberwire. The subscapularis was  released off of the bone.   I then performed circumferential releases of the humerus, and then dislocated the head, and then reamed with the reamer to the above named size.  I then applied the jig, and cut the humeral head in 30 of retroversion, and then turned my attention to the glenoid.  I actually cut the humerus twice, because the first cut looked more like a standard total shoulder cut.  Deep retractors were placed, and I resected the labrum, and then placed a guidepin into the center position on the glenoid, with slight inferior inclination. I then reamed over the guidepin, and this created a small metaphyseal cancellus blush inferiorly, removing just the cartilage to the subchondral bone superiorly. The base plate was selected and impacted place, and then I secured it centrally with a nonlocking screw, and I had excellent purchase both inferiorly and superiorly. I placed a short locking screws on anterior and posterior aspects.  I then turned my attention to the glenosphere, and impacted this into place, placing slight inferior offset (set on B).   The glenoid sphere was completely seated, and had engagement of the [facility] taper. I then turned my attention back to the humerus.  I sequentially broached, and then trialed, and was found to restore soft tissue tension, and it had 2 finger tightness. Therefore the above named components were selected. The shoulder felt stable throughout functional motion.  Before I placed the real prosthesis I had also placed a total of 3 #2 FiberWire through drill holes in the humerus for later subscapularis repair.  I then impacted the real prosthesis into place, as well as the real humeral tray, and reduced the shoulder. The shoulder had excellent motion, and was stable, and I irrigated the  wounds copiously.    I then used these to repair the subscapularis. This came down to bone.  I then irrigated the shoulder copiously once more, repaired the deltopectoral  interval with Vicryl followed by subcutaneous Vicryl with Steri-Strips and sterile gauze for the skin. The patient was awakened and returned back in stable and satisfactory condition. There no complications and She tolerated the procedure well.

## 2017-11-24 ENCOUNTER — Encounter (HOSPITAL_COMMUNITY): Payer: Self-pay | Admitting: Orthopedic Surgery

## 2017-11-24 LAB — CBC
HCT: 30.2 % — ABNORMAL LOW (ref 36.0–46.0)
Hemoglobin: 10.1 g/dL — ABNORMAL LOW (ref 12.0–15.0)
MCH: 30.6 pg (ref 26.0–34.0)
MCHC: 33.4 g/dL (ref 30.0–36.0)
MCV: 91.5 fL (ref 78.0–100.0)
Platelets: 175 10*3/uL (ref 150–400)
RBC: 3.3 MIL/uL — AB (ref 3.87–5.11)
RDW: 13.5 % (ref 11.5–15.5)
WBC: 8.1 10*3/uL (ref 4.0–10.5)

## 2017-11-24 LAB — BASIC METABOLIC PANEL
Anion gap: 8 (ref 5–15)
BUN: 26 mg/dL — AB (ref 6–20)
CHLORIDE: 101 mmol/L (ref 101–111)
CO2: 26 mmol/L (ref 22–32)
Calcium: 8.8 mg/dL — ABNORMAL LOW (ref 8.9–10.3)
Creatinine, Ser: 1.32 mg/dL — ABNORMAL HIGH (ref 0.44–1.00)
GFR calc Af Amer: 45 mL/min — ABNORMAL LOW (ref >60)
GFR calc non Af Amer: 39 mL/min — ABNORMAL LOW (ref >60)
Glucose, Bld: 131 mg/dL — ABNORMAL HIGH (ref 65–99)
POTASSIUM: 3.7 mmol/L (ref 3.5–5.1)
Sodium: 135 mmol/L (ref 135–145)

## 2017-11-24 NOTE — Evaluation (Signed)
Occupational Therapy Evaluation Patient Details Name: [name] Hamilton MRN: [id number] DOB: [birth year] Today's Date: 11/24/2017    History of Present Illness 77 y.o. female admitted s/p R reverse total shoulder surgery (11/23/17). PMH includes but not limited to HTN, hypercholesterolemia, mitral regurgitation, AVM, and anxiety.    Clinical Impression   PTA, pt was living with his husband who assisted as needed with ADLs due to right shoulder pain. Pt currently requiring Min A for LB ADLs, Min A for UB bathing, and Max A for UB dressing and sling management. Providing pt with education and handout on shoulder precautions, sling management/positioning, UE exercises, UB compensatory techniques, and sleep positioning. Pt and family demonstrating understanding with Min-Mod VCs for adherence to shoulder precautions. Recommend dc home with initial 24 hour support. All education provided, questions answered, and acute OT needs met. Will sign off. Thank you.     Follow Up Recommendations  Follow surgeon's recommendation for DC plan and follow-up therapies;Supervision/Assistance - 24 hour    Equipment Recommendations  None recommended by OT    Recommendations for Other Services       Precautions / Restrictions Precautions Precautions: Fall;Shoulder Type of Shoulder Precautions: Conservative protocal. No shoulder ROM. WFL elbow, wrist, and hand. Shoulder Interventions: Shoulder sling/immobilizer;At all times;Off for dressing/bathing/exercises Precaution Booklet Issued: Yes (comment) Precaution Comments: Reviewed all should precautions and adherance during ADLs Required Braces or Orthoses: Sling Restrictions Weight Bearing Restrictions: No      Mobility Bed Mobility Overal bed mobility: Modified Independent             General bed mobility comments: increased time and use of bed rail  Transfers Overall transfer level: Modified independent Equipment used: None              General transfer comment: sit<>stand    Balance Overall balance assessment: Needs assistance Sitting-balance support: Feet supported;No upper extremity supported Sitting balance-Leahy Scale: Good     Standing balance support: During functional activity;No upper extremity supported Standing balance-Leahy Scale: Fair Standing balance comment: able to perform static standing and ambulate unsupported without LOB, very mild instability likely due to recent anesthesia during surgery                           ADL either performed or assessed with clinical judgement   ADL Overall ADL's : Needs assistance/impaired Eating/Feeding: Set up;Sitting   Grooming: Set up;Standing;Brushing hair   Upper Body Bathing: Minimal assistance;Standing Upper Body Bathing Details (indicate cue type and reason): Reviewed compensatory techniques for UB bathing while seated and maintaining no shoulder movements. Pt washing under left axillary area at sink (with sling) with cues to adhere to shoulder precautions. Pt attempting to reach left axillary with RUE; cues for shoulder precautions. Lower Body Bathing: With caregiver independent assisting;Sit to/from stand;Minimal assistance   Upper Body Dressing : Sitting;With caregiver independent assisting;Maximal assistance Upper Body Dressing Details (indicate cue type and reason): Educated pt and family on compensatory techniques for UB dressing and sling management. pt and family demosntrating understanding.  Lower Body Dressing: Minimal assistance;With caregiver independent assisting;Sit to/from stand Lower Body Dressing Details (indicate cue type and reason): Daughter assisting with LB dressing Toilet Transfer: Supervision/safety;Ambulation(Simulated in room)           Functional mobility during ADLs: Supervision/safety General ADL Comments: Providing education on shoulder precautions, excericses, UB compensatory techniques, and sleep positioning. Pt  and family demonstrating understanding. Providing handout with all information for carry over to home  Vision         Perception     Praxis      Pertinent Vitals/Pain Pain Assessment: Faces Faces Pain Scale: Hurts a little bit Pain Location: Right shoulder Pain Descriptors / Indicators: Discomfort;Grimacing Pain Intervention(s): Monitored during session     Hand Dominance Right   Extremity/Trunk Assessment Upper Extremity Assessment Upper Extremity Assessment: RUE deficits/detail RUE Deficits / Details: Shoulder precautions conservative protocal. No shoulder ROM. WFL for elbow, wrist, and hand. Reports tingling in fingers. RUE: Unable to fully assess due to immobilization RUE Sensation: ("tingling") RUE Coordination: decreased gross motor   Lower Extremity Assessment Lower Extremity Assessment: Overall WFL for tasks assessed   Cervical / Trunk Assessment Cervical / Trunk Assessment: Normal   Communication Communication Communication: No difficulties   Cognition Arousal/Alertness: Awake/alert Behavior During Therapy: WFL for tasks assessed/performed Overall Cognitive Status: Within Functional Limits for tasks assessed                                     General Comments  Pt daughter present throughout    Exercises Exercises: Shoulder Shoulder Exercises Elbow Flexion: AROM;Right;15 reps;Seated;Other (comment)(Adhering to precautions) Elbow Extension: AROM;Right;15 reps;Seated(Adhering to precautions) Wrist Flexion: AROM;Right;15 reps;Seated(Adhering to precautions) Wrist Extension: AROM;Right;15 reps;Seated(Adhering to precautions) Digit Composite Flexion: AROM;Right;15 reps;Seated Composite Extension: AROM;Right;15 reps;Seated(Adhering to precautions) Neck Flexion: AROM;5 reps;Seated Neck Extension: AROM;5 reps;Seated Neck Lateral Flexion - Right: AROM;5 reps;Seated Neck Lateral Flexion - Left: AROM;5 reps;Seated   Shoulder Instructions  Shoulder Instructions Donning/doffing shirt without moving shoulder: Minimal assistance;Caregiver independent with task;Patient able to independently direct caregiver Method for sponge bathing under operated UE: Minimal assistance;Caregiver independent with task;Patient able to independently direct caregiver Donning/doffing sling/immobilizer: Maximal assistance;Caregiver independent with task;Patient able to independently direct caregiver Correct positioning of sling/immobilizer: Maximal assistance;Caregiver independent with task;Patient able to independently direct caregiver ROM for elbow, wrist and digits of operated UE: Supervision/safety Sling wearing schedule (on at all times/off for ADL's): Supervision/safety Proper positioning of operated UE when showering: Supervision/safety Positioning of UE while sleeping: Minimal assistance    Home Living Family/patient expects to be discharged to:: Private residence Living Arrangements: Spouse/significant other Available Help at Discharge: Family;Available 24 hours/day Type of Home: House Home Access: Stairs to enter [employer] of Steps: 4-5 Entrance Stairs-Rails: Right Home Layout: Multi-level;Able to live on main level with bedroom/bathroom Alternate Level Stairs-Number of Steps: has stair chair lift   Bathroom Shower/Tub: Occupational psychologist: Standard     Home Equipment: Bedside commode          Prior Functioning/Environment Level of Independence: Needs assistance  Gait / Transfers Assistance Needed: Independent ADL's / Homemaking Assistance Needed: assistance from husband due to R shoulder problems            OT Problem List: Decreased strength;Decreased range of motion;Decreased activity tolerance;Impaired balance (sitting and/or standing);Decreased knowledge of use of DME or AE;Decreased knowledge of precautions;Impaired UE functional use;Pain      OT Treatment/Interventions:      OT  Goals(Current goals can be found in the care plan section) Acute Rehab OT Goals Patient Stated Goal: to get better OT Goal Formulation: All assessment and education complete, DC therapy  OT Frequency:     Barriers to D/C:            Co-evaluation              AM-PAC PT "6 Clicks" Daily  Activity     Outcome Measure Help from another person eating meals?: None Help from another person taking care of personal grooming?: A Little Help from another person toileting, which includes using toliet, bedpan, or urinal?: A Little Help from another person bathing (including washing, rinsing, drying)?: A Lot Help from another person to put on and taking off regular upper body clothing?: A Lot Help from another person to put on and taking off regular lower body clothing?: A Lot 6 Click Score: 16   End of Session Equipment Utilized During Treatment: Other (comment)(Sling) Nurse Communication: Mobility status;Precautions  Activity Tolerance: Patient tolerated treatment well Patient left: in bed;with call bell/phone within reach;with family/visitor present  OT Visit Diagnosis: Muscle weakness (generalized) (M62.81);Pain Pain - Right/Left: Right Pain - part of body: Shoulder                Time: 1103-1594 OT Time Calculation (min): 46 min Charges:  OT General Charges $OT Visit: 1 Visit OT Evaluation $OT Eval Moderate Complexity: 1 Mod OT Treatments $Self Care/Home Management : 23-37 mins G-Codes:     [place], OTR/L Acute Rehab Pager: [phone] Office: [place] 11/24/2017, 10:50 AM

## 2017-11-24 NOTE — Discharge Summary (Signed)
Physician Discharge Summary  Patient ID: [name] Hamilton MRN: [id number] DOB/AGE: 75-Jul-1942 75 y.o.  Admit date: 11/23/2017 Discharge date: 11/24/2017  Admission Diagnoses:  Right rotator cuff tear arthropathy  Discharge Diagnoses:  Principal Problem:   Right rotator cuff tear arthropathy Active Problems:   Primary localized osteoarthrosis of shoulder   Past Medical History:  Diagnosis Date  . Anemia    with knee replacement  . Anxiety    depression at times  . Aortic valve sclerosis    mild AV calcification, very mild AS (peak grad 18, mean grad 9, AVA 1.02 cm) 11/2015 [facility] CV)  . Arthritis   . AVM (arteriovenous malformation)       . Chest pain    Nuclear, 2005, normal  . Ejection fraction    EF 55-60%, echo, September, 2013  . Fractured coccyx ([place])   . GERD (gastroesophageal reflux disease)   . Hearing loss of left ear   . History of hiatal hernia    "mild one"  . HTN (hypertension)   . Hypercholesteremia   . Mitral regurgitation    mild-mod MR 11/2015 echo [facility] CV)  . Pneumonia 1967   hx of  . Right rotator cuff tear arthropathy 11/23/2017  . UTI (urinary tract infection)    hx of    Surgeries: Procedure(s): TOTAL REVERSE SHOULDER ARTHROPLASTY on 11/23/2017   Consultants (if any):   Discharged Condition: Improved  Hospital Course: [name] Hamilton is an 77 y.o. female who was admitted 11/23/2017 with a diagnosis of Right rotator cuff tear arthropathy and went to the operating room on 11/23/2017 and underwent the above named procedures.    She was given perioperative antibiotics:  Anti-infectives (From admission, onward)   Start     Dose/Rate Route Frequency Ordered Stop   11/23/17 1400  ceFAZolin (ANCEF) IVPB 1 g/50 mL premix     1 g 100 mL/hr over 30 Minutes Intravenous Every 6 hours 11/23/17 1153 11/24/17 0144   11/23/17 0547  ceFAZolin (ANCEF) IVPB 2g/100 mL premix     2 g 200 mL/hr over 30 Minutes Intravenous On call to O.R. 11/23/17 0547  11/23/17 0749   11/23/17 0544  ceFAZolin (ANCEF) 2-4 GM/100ML-% IVPB    Comments:  Rosenberger, Meredit: cabinet override      11/23/17 0544 11/23/17 0749    .  She was given sequential compression devices, early ambulation,  for DVT prophylaxis.  She benefited maximally from the hospital stay and there were no complications.  NVI postop day 1.  Recent vital signs:  Vitals:   11/24/17 0430 11/24/17 0801  BP: 90/60 95/63  Pulse: 62 62  Resp: 18 18  Temp: 97.9 F (36.6 C) 97.8 F (36.6 C)  SpO2: 100% 100%    Recent laboratory studies:  Lab Results  Component Value Date   HGB 10.1 (L) 11/24/2017   HGB 13.2 11/10/2017   HGB 9.6 (L) 02/20/2016   Lab Results  Component Value Date   WBC 8.1 11/24/2017   PLT 175 11/24/2017   Lab Results  Component Value Date   INR 0.98 02/08/2013   Lab Results  Component Value Date   NA 135 11/24/2017   K 3.7 11/24/2017   CL 101 11/24/2017   CO2 26 11/24/2017   BUN 26 (H) 11/24/2017   CREATININE 1.32 (H) 11/24/2017   GLUCOSE 131 (H) 11/24/2017    Discharge Medications:   Allergies as of 11/24/2017      Reactions   Adhesive [tape] Rash   Latex  Rash      Medication List    TAKE these medications   aspirin EC 81 MG tablet Take 81 mg by mouth daily with supper.   atenolol 50 MG tablet Commonly known as:  TENORMIN Take 1 tablet by mouth  every morning.   HOLD if BP less then 120/80. What changed:    how much to take  how to take this  when to take this  additional instructions   atorvastatin 20 MG tablet Commonly known as:  LIPITOR Take 1 tablet (20 mg total) by mouth at bedtime. What changed:  when to take this   baclofen 10 MG tablet Commonly known as:  LIORESAL Take 1 tablet (10 mg total) by mouth 3 (three) times daily. As needed for muscle spasm   HYDROcodone-acetaminophen 10-325 MG tablet Commonly known as:  NORCO Take 1 tablet by mouth every 6 (six) hours as needed.   loratadine 10 MG tablet Commonly  known as:  CLARITIN Take 10 mg by mouth daily as needed for allergies.   olmesartan 40 MG tablet Commonly known as:  BENICAR Take 40 mg by mouth every morning.   ondansetron 4 MG tablet Commonly known as:  ZOFRAN Take 1 tablet (4 mg total) by mouth every 8 (eight) hours as needed for nausea or vomiting.   quinapril 20 MG tablet Commonly known as:  ACCUPRIL Take 1 tablet by mouth  Daily.   HOLD if BP less than 120/80.   sennosides-docusate sodium 8.6-50 MG tablet Commonly known as:  SENOKOT-S Take 2 tablets by mouth daily.   triamterene-hydrochlorothiazide 37.5-25 MG tablet Commonly known as:  MAXZIDE-25 Take 1 tablet by mouth  every morning   VITAMIN D-3 PO Take 1 tablet by mouth daily.   zolpidem 10 MG tablet Commonly known as:  AMBIEN Take 5 mg by mouth at bedtime as needed for sleep.       Diagnostic Studies: Dg Shoulder Right Port  Result Date: 11/23/2017 CLINICAL DATA:  Post right shoulder replacement EXAM: PORTABLE RIGHT SHOULDER COMPARISON:  Chest x-ray of 02/08/2013 FINDINGS: A single portable view shows a right total shoulder replacement. The glenoid and humeral components of the prosthesis appear to be in good position with no complicating features noted. IMPRESSION: Right total shoulder replacement. No complicating features on the single view obtained. Electronically Signed   By: Ivar Drape M.D.   On: 11/23/2017 10:58    Disposition: 06-Home-Health Care Svc    Follow-up Information    Marchia Bond, MD. Schedule an appointment as soon as possible for a visit in 2 weeks.   Specialty:  Orthopedic Surgery Contact information: [address] [place] [address] [phone]            Signed: Johnny Bridge 11/24/2017, 8:23 AM

## 2017-11-24 NOTE — Progress Notes (Signed)
Pt and daughter given D/C instructions, with verbal understanding. Pt's IV was removed prior to D/C. Pt's incision has no sign of infection. Pt's Rx's were sent to pharmacy by MD. Pt D/C'd home via wheelchair @ 1045 per MD order. Pt is stable @ D/C and has no other needs at this time. Holli Humbles, RN

## 2018-01-24 ENCOUNTER — Ambulatory Visit: Payer: Medicare Other | Admitting: Obstetrics & Gynecology

## 2018-05-09 ENCOUNTER — Ambulatory Visit (INDEPENDENT_AMBULATORY_CARE_PROVIDER_SITE_OTHER): Payer: Medicare Other | Admitting: Obstetrics & Gynecology

## 2018-05-09 ENCOUNTER — Other Ambulatory Visit (HOSPITAL_COMMUNITY)
Admission: RE | Admit: 2018-05-09 | Discharge: 2018-05-09 | Disposition: A | Discharge status: Home / Self Care | Payer: Medicare Other | Source: Ambulatory Visit | Attending: Obstetrics & Gynecology | Admitting: Obstetrics & Gynecology

## 2018-05-09 ENCOUNTER — Other Ambulatory Visit: Payer: Self-pay

## 2018-05-09 ENCOUNTER — Encounter: Payer: Self-pay | Admitting: Obstetrics & Gynecology

## 2018-05-09 VITALS — BP 144/80 | HR 68 | Resp 16 | Ht 58.75 in | Wt 148.2 lb

## 2018-05-09 DIAGNOSIS — Z01419 Encounter for gynecological examination (general) (routine) without abnormal findings: Secondary | ICD-10-CM

## 2018-05-09 DIAGNOSIS — Z124 Encounter for screening for malignant neoplasm of cervix: Secondary | ICD-10-CM | POA: Insufficient documentation

## 2018-05-09 NOTE — Progress Notes (Signed)
77 y.o. [name] Hamilton MarriedIndianF here for annual exam.  Doing well but fractured right shoulder after a fall.  Had total reverse shoulder arthroplasty.  Dr. Robbie Lis did the surgery.  Was very pleased with him and her recovery.    Denies vaginal bleeding.     PCP:  Dr. Dagmar Hait.  Has physical exam scheduled in September.    No LMP recorded. Patient is postmenopausal.          Sexually active: No.  The current method of family planning is post menopausal status.    Exercising: No.   Smoker:  no  Health Maintenance: Pap:  01/03/16 Neg   09/18/13 Neg  History of abnormal Pap:  no MMG:  09/07/16 BIRADS1:Neg.  Did not go due to shoulder surgery. Colonoscopy:  2012 Normal. F/u 10 years  BMD:   2015 Osteopenia   TDaP:  2015 Pneumonia vaccine(s):  done Shingrix: Zostavax Hep C testing: n/a Screening Labs: PCP   reports that she has never smoked. She has never used smokeless tobacco. She reports that she does not drink alcohol or use drugs.  Past Medical History:  Diagnosis Date  . Anemia    with knee replacement  . Anxiety    depression at times  . Aortic valve sclerosis    mild AV calcification, very mild AS (peak grad 18, mean grad 9, AVA 1.02 cm) 11/2015 [facility] CV)  . Arthritis   . AVM (arteriovenous malformation)       . Chest pain    Nuclear, 2005, normal  . Ejection fraction    EF 55-60%, echo, September, 2013  . Fractured coccyx ([place])   . GERD (gastroesophageal reflux disease)   . Hearing loss of left ear   . History of hiatal hernia    "mild one"  . HTN (hypertension)   . Hypercholesteremia   . Mitral regurgitation    mild-mod MR 11/2015 echo [facility] CV)  . Pneumonia 1967   hx of    Past Surgical History:  Procedure Laterality Date  . cataract surgery      bilateral   . CHOLECYSTECTOMY    . lumbar spinal stenosis surgery without any complications    . REVERSE SHOULDER ARTHROPLASTY Right 11/23/2017   Procedure: TOTAL REVERSE SHOULDER ARTHROPLASTY;  Surgeon: Marchia Bond, MD;  Location: [place];  Service: Orthopedics;  Laterality: Right;  . TENDON REPAIR Left 2013   in forarm  . TONSILLECTOMY    . TOTAL HIP ARTHROPLASTY Right 02/18/2016   Procedure: RIGHT TOTAL HIP ARTHROPLASTY ANTERIOR APPROACH;  Surgeon: Paralee Cancel, MD;  Location: [facility];  Service: Orthopedics;  Laterality: Right;  . TOTAL KNEE ARTHROPLASTY Right 2010   Dr. Cay Schillings  . TOTAL KNEE ARTHROPLASTY Left 02/16/2013   Procedure: LEFT TOTAL KNEE ARTHROPLASTY;  Surgeon: Tobi Bastos, MD;  Location: [facility];  Service: Orthopedics;  Laterality: Left;    Current Outpatient Medications  Medication Sig Dispense Refill  . aspirin EC 81 MG tablet Take 81 mg by mouth daily with supper.    Marland Kitchen atenolol (TENORMIN) 50 MG tablet Take 1 tablet by mouth  every morning.   HOLD if BP less then 120/80. (Patient taking differently: Take 50 mg by mouth at bedtime. HOLD if BP less then 120/80.) 90 tablet 0  . atorvastatin (LIPITOR) 20 MG tablet Take 1 tablet (20 mg total) by mouth at bedtime. (Patient taking differently: Take 20 mg by mouth daily with supper. ) 90 tablet 3  . Cholecalciferol (VITAMIN D-3 PO) Take  1 tablet by mouth daily.    . fluticasone (FLONASE) 50 MCG/ACT nasal spray daily as needed.    . loratadine (CLARITIN) 10 MG tablet Take 10 mg by mouth daily as needed for allergies.    Marland Kitchen quinapril (ACCUPRIL) 20 MG tablet Take 1 tablet by mouth  Daily.   HOLD if BP less than 120/80. 90 tablet 0  . triamterene-hydrochlorothiazide (MAXZIDE-25) 37.5-25 MG tablet Take 1 tablet by mouth  every morning 90 tablet 0  . zolpidem (AMBIEN) 10 MG tablet Take 5 mg by mouth at bedtime as needed for sleep.      No current facility-administered medications for this visit.     Family History  Problem Relation Age of Onset  . Heart attack Father   . Cancer Mother     Review of Systems  All other systems reviewed and are negative.   Exam:   BP (!) 144/80 (BP Location: Left Arm, Patient Position: Sitting, Cuff  Size: Large)   Pulse 68   Resp 16   Ht 4' 10.75" (1.492 m)   Wt 148 lb 3.2 oz (67.2 kg)   BMI 30.19 kg/m    Height: 4' 10.75" (149.2 cm)  Ht Readings from Last 3 Encounters:  05/09/18 4' 10.75" (1.492 m)  11/10/17 4\' 11"  (1.499 m)  01/18/17 4\' 11"  (1.499 m)    General appearance: alert, cooperative and appears stated age Head: Normocephalic, without obvious abnormality, atraumatic Neck: no adenopathy, supple, symmetrical, trachea midline and thyroid normal to inspection and palpation Lungs: clear to auscultation bilaterally Breasts: normal appearance, no masses or tenderness Heart: regular rate and rhythm Abdomen: soft, non-tender; bowel sounds normal; no masses,  no organomegaly Extremities: extremities normal, atraumatic, no cyanosis or edema Skin: Skin color, texture, turgor normal. No rashes or lesions Lymph nodes: Cervical, supraclavicular, and axillary nodes normal. No abnormal inguinal nodes palpated Neurologic: Grossly normalpapPelvic: External genitalia:  no lesions              Urethra:  normal appearing urethra with no masses, tenderness or lesions              Bartholins and Skenes: normal                 Vagina: normal appearing vagina with normal color and discharge, no lesions              Cervix: no lesions              Pap taken: Yes.   Bimanual Exam:  Uterus:  normal size, contour, position, consistency, mobility, non-tender              Adnexa: normal adnexa and no mass, fullness, tenderness               Rectovaginal: Confirms               Anus:  normal sphincter tone, no lesions  Chaperone was present for exam.  A:  Well Woman with normal exam PMP, no HRT Hypertension Elevated lipids  P:   Mammogram due.  Information given to her. pap smear obtained today Lab work planned and appt is schedule with Dr. Dagmar Hait in September return annually or prn

## 2018-05-10 LAB — CYTOLOGY - PAP: DIAGNOSIS: NEGATIVE

## 2018-07-02 IMAGING — CR DG SHOULDER 2+V PORT*R*
1 series · 1 of 1 positions shown · non-contrast
Comparison: Chest x-ray of 02/08/2013

CLINICAL DATA: Post right shoulder replacement

EXAM:
PORTABLE RIGHT SHOULDER

[AP]
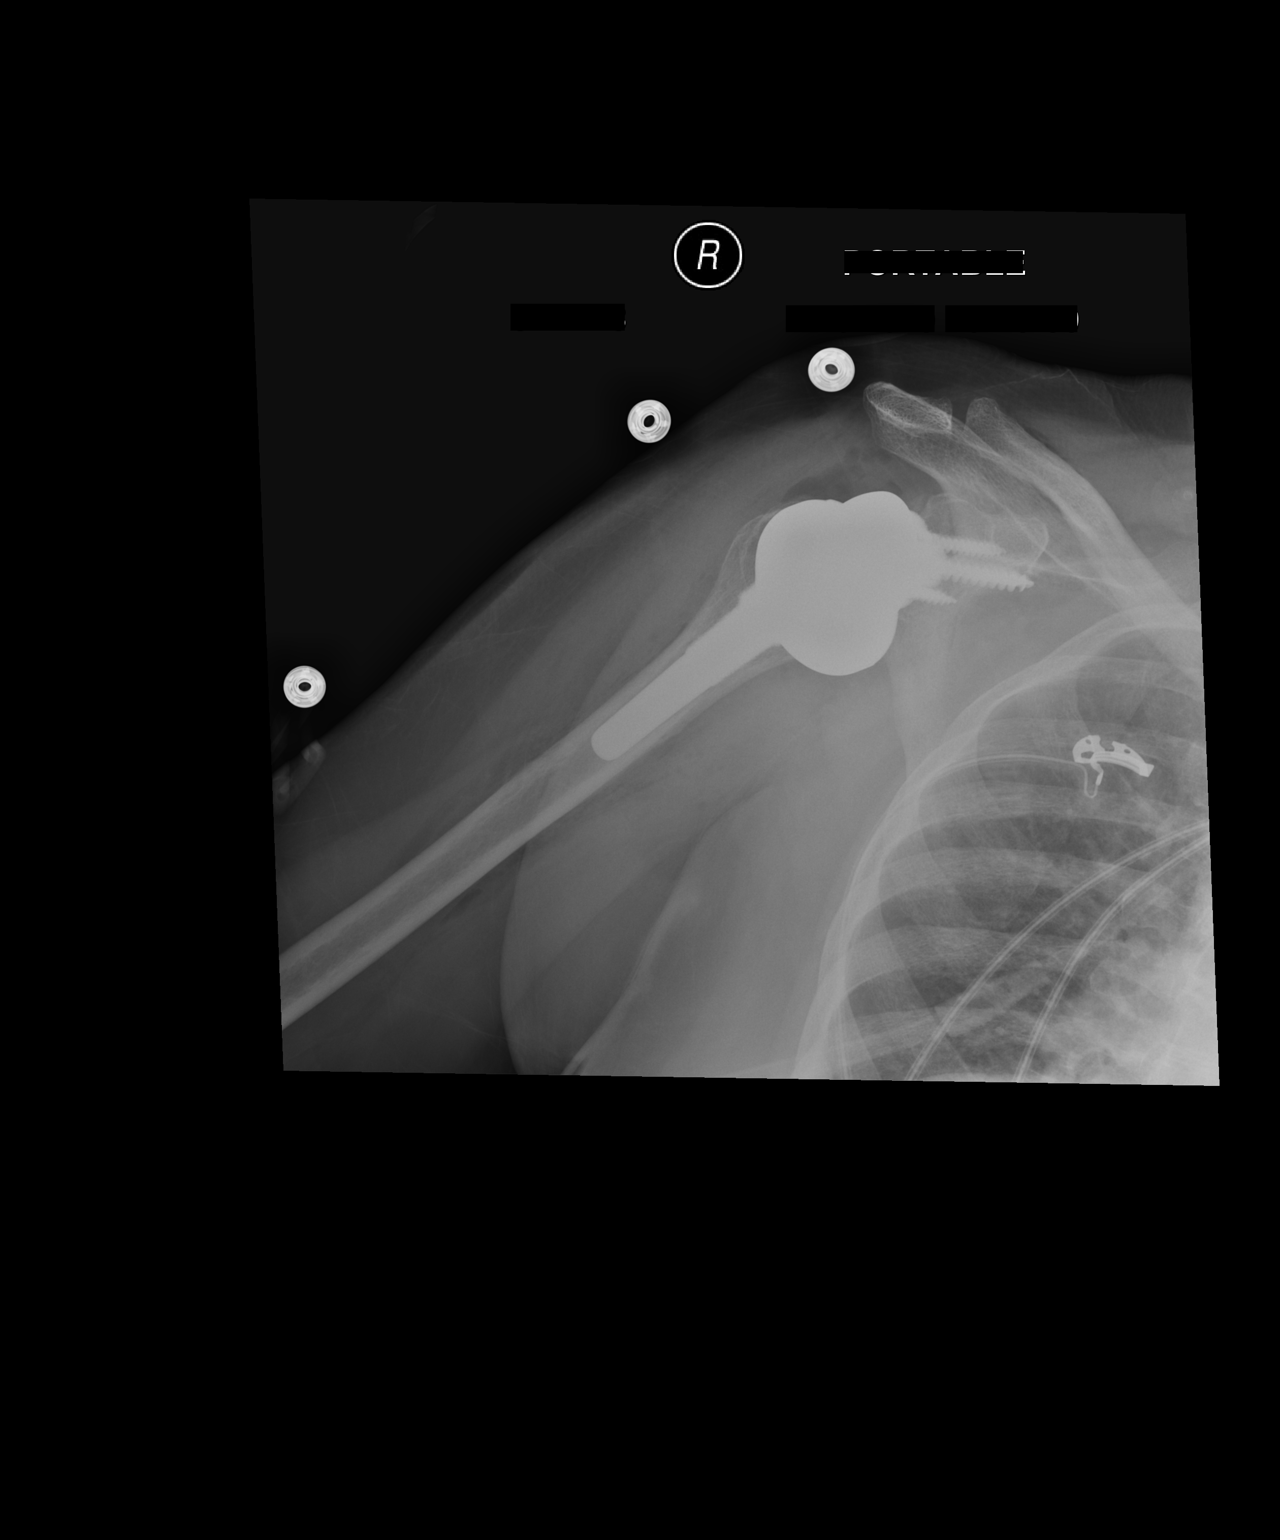

[1 of 1 positions shown; findings below may reference images not displayed]

FINDINGS: A single portable view shows a right total shoulder replacement. The
glenoid and humeral components of the prosthesis appear to be in
good position with no complicating features noted.
IMPRESSION: Right total shoulder replacement. No complicating features on the
single view obtained.

## 2019-01-30 ENCOUNTER — Ambulatory Visit: Payer: Self-pay | Admitting: Cardiology

## 2019-07-03 ENCOUNTER — Telehealth (HOSPITAL_COMMUNITY): Payer: Self-pay | Admitting: *Deleted

## 2019-07-03 NOTE — Telephone Encounter (Addendum)
07/04/19 11:30 spoke w/Jennifer at [facility], advised to discontinue efforts to reach pt.  They will resend referral order if patient chooses to proceed.  9:30 am - same issue as before rings once, then answers, then hang up.  No other phone number listed for this patient.  Will contact Anderson Malta w/[facility] for guidance suggestions  07/04/19 9:35 amJennifer provided alternate number of [phone], I called and it rang over 15 times with no answer and no voicemail.    07/03/19 11:25 am- I am the second staff member attempting to contact this pt to schedule [place].  Calls ring and seem like they are being answered and then call is dropped.  Third call attempt to this pt.  Only number available is the [phone], 1:00 pm most recent attempt, rang and was picked up then disconnected.

## 2019-09-06 ENCOUNTER — Other Ambulatory Visit: Payer: Self-pay

## 2019-09-07 ENCOUNTER — Ambulatory Visit (INDEPENDENT_AMBULATORY_CARE_PROVIDER_SITE_OTHER): Payer: Medicare Other | Admitting: Obstetrics & Gynecology

## 2019-09-07 ENCOUNTER — Encounter: Payer: Self-pay | Admitting: Obstetrics & Gynecology

## 2019-09-07 VITALS — BP 122/70 | HR 68 | Temp 97.9°F | Ht 59.0 in | Wt 142.2 lb

## 2019-09-07 DIAGNOSIS — Z01419 Encounter for gynecological examination (general) (routine) without abnormal findings: Secondary | ICD-10-CM | POA: Diagnosis not present

## 2019-09-07 NOTE — Progress Notes (Signed)
78 y.o. G3P2 Married Other or two or more races female here for annual exam.  Doing well.  Denies vaginal bleeding.    PCP:  Dr. Dagmar Hait.  Had blood work in October.  Reports this was really good.  No LMP recorded. Patient is postmenopausal.          Sexually active: Yes.    The current method of family planning is post menopausal status.    Exercising: Yes.    walking  Smoker:  no  Health Maintenance: Pap: 05/09/18  Normal  01/03/16 Neg              09/18/13 Neg  History of abnormal Pap:  no MMG:  09/07/16 BIRADS1:Neg.  She is aware this is due.  She is going to have this done in 2021 Colonoscopy:  2012 normal f/u 10 years BMD:   2018 with Dr. Dagmar Hait TDaP:  2015 Pneumonia vaccine(s):  completed Shingrix:   Zostavax Hep C testing: N/A Screening Labs: PCP   reports that she has never smoked. She has never used smokeless tobacco. She reports that she does not drink alcohol or use drugs.  Past Medical History:  Diagnosis Date  . Anemia    with knee replacement  . Anxiety    depression at times  . Aortic valve sclerosis    mild AV calcification, very mild AS (peak grad 18, mean grad 9, AVA 1.02 cm) 11/2015 [facility] CV)  . Arthritis   . AVM (arteriovenous malformation)       . Chest pain    Nuclear, 2005, normal  . Ejection fraction    EF 55-60%, echo, September, 2013  . Fractured coccyx ([place])   . GERD (gastroesophageal reflux disease)   . Hearing loss of left ear   . History of hiatal hernia    "mild one"  . HTN (hypertension)   . Hypercholesteremia   . Mitral regurgitation    mild-mod MR 11/2015 echo [facility] CV)  . Pneumonia 1967   hx of    Past Surgical History:  Procedure Laterality Date  . cataract surgery      bilateral   . CHOLECYSTECTOMY    . lumbar spinal stenosis surgery without any complications    . REVERSE SHOULDER ARTHROPLASTY Right 11/23/2017   Procedure: TOTAL REVERSE SHOULDER ARTHROPLASTY;  Surgeon: Marchia Bond, MD;  Location: [place];  Service:  Orthopedics;  Laterality: Right;  . TENDON REPAIR Left 2013   in forarm  . TONSILLECTOMY    . TOTAL HIP ARTHROPLASTY Right 02/18/2016   Procedure: RIGHT TOTAL HIP ARTHROPLASTY ANTERIOR APPROACH;  Surgeon: Paralee Cancel, MD;  [facility];  Service: Orthopedics;  Laterality: Right;  . TOTAL KNEE ARTHROPLASTY Right 2010   Dr. Cay Schillings  . TOTAL KNEE ARTHROPLASTY Left 02/16/2013   Procedure: LEFT TOTAL KNEE ARTHROPLASTY;  Surgeon: Tobi Bastos, MD;  [facility];  Service: Orthopedics;  Laterality: Left;    Current Outpatient Medications  Medication Sig Dispense Refill  . aspirin EC 81 MG tablet Take 81 mg by mouth daily with supper.    Marland Kitchen atenolol (TENORMIN) 50 MG tablet Take 1 tablet by mouth  every morning.   HOLD if BP less then 120/80. (Patient taking differently: Take 50 mg by mouth at bedtime. HOLD if BP less then 120/80.) 90 tablet 0  . atorvastatin (LIPITOR) 20 MG tablet Take 1 tablet (20 mg total) by mouth at bedtime. (Patient taking differently: Take 20 mg by mouth daily with supper. ) 90 tablet  3  . Cholecalciferol (VITAMIN D-3 PO) Take 1 tablet by mouth daily.    . fluticasone (FLONASE) 50 MCG/ACT nasal spray daily as needed.    . loratadine (CLARITIN) 10 MG tablet Take 10 mg by mouth daily as needed for allergies.    Marland Kitchen quinapril (ACCUPRIL) 20 MG tablet Take 1 tablet by mouth  Daily.   HOLD if BP less than 120/80. 90 tablet 0  . triamterene-hydrochlorothiazide (MAXZIDE-25) 37.5-25 MG tablet Take 1 tablet by mouth  every morning 90 tablet 0  . zolpidem (AMBIEN) 10 MG tablet Take 5 mg by mouth at bedtime as needed for sleep.      No current facility-administered medications for this visit.    Family History  Problem Relation Age of Onset  . Heart attack Father   . Cancer Mother     Review of Systems  All other systems reviewed and are negative.   Exam:   Vitals:   09/07/19 1504  BP: 122/70  Pulse: 68  Temp: 97.9 F (36.6 C)  SpO2: 97%   General appearance:  alert, cooperative and appears stated age Head: Normocephalic, without obvious abnormality, atraumatic Neck: no adenopathy, supple, symmetrical, trachea midline and thyroid normal to inspection and palpation Lungs: clear to auscultation bilaterally Breasts: normal appearance, no masses or tenderness Heart: regular rate and rhythm Abdomen: soft, non-tender; bowel sounds normal; no masses,  no organomegaly Extremities: extremities normal, atraumatic, no cyanosis or edema Skin: Skin color, texture, turgor normal. No rashes or lesions Lymph nodes: Cervical, supraclavicular, and axillary nodes normal. No abnormal inguinal nodes palpated Neurologic: Grossly normal   Pelvic: External genitalia:  no lesions              Urethra:  normal appearing urethra with no masses, tenderness or lesions              Bartholins and Skenes: normal                 Vagina: normal appearing vagina with normal color and discharge, no lesions              Cervix: no lesions              Pap taken: No. Bimanual Exam:  Uterus:  normal size, contour, position, consistency, mobility, non-tender              Adnexa: normal adnexa and no mass, fullness, tenderness               Rectovaginal: Confirms               Anus:  normal sphincter tone, no lesions  Chaperone was present for exam.  A:  Well Woman with normal exam PMP, no HRT Hypertension H/O elevated lipids  P:   Mammogram guidelines reviewed.  She is going to have this done in 2021.  Does not want to go before the end of the year. Release of records for BMD from Dr. Danna Hefty office signed Vaccines are UTD pap smear neg 2019.  Not indicated today. Lab work was done in October with Dr. Elsworth Soho Return annually or prn

## 2019-09-08 ENCOUNTER — Ambulatory Visit: Payer: Medicare Other | Admitting: Obstetrics & Gynecology

## 2019-10-21 ENCOUNTER — Ambulatory Visit: Payer: Medicare Other | Attending: Internal Medicine

## 2019-10-21 ENCOUNTER — Ambulatory Visit: Payer: Medicare Other

## 2019-10-21 DIAGNOSIS — Z23 Encounter for immunization: Secondary | ICD-10-CM

## 2019-10-21 NOTE — Progress Notes (Signed)
   Covid-19 Vaccination Clinic  Name:  [name] Hamilton    MRN: [id number] DOB: 05-Dec-1940  10/21/2019  [name] was observed post Covid-19 immunization for 15 minutes without incidence. She was provided with Vaccine Information Sheet and instruction to access the V-Safe system.   [name] was instructed to call 911 with any severe reactions post vaccine: Marland Kitchen Difficulty breathing  . Swelling of your face and throat  . A fast heartbeat  . A bad rash all over your body  . Dizziness and weakness    Immunizations Administered    Name Date Dose VIS Date Route   Pfizer COVID-19 Vaccine 10/21/2019  1:50 PM 0.3 mL 09/08/2019 Intramuscular   Manufacturer: [place]   Lot: [id number]   [place]: [id number]

## 2019-11-11 ENCOUNTER — Ambulatory Visit: Payer: Medicare Other | Attending: Internal Medicine

## 2019-11-11 DIAGNOSIS — Z23 Encounter for immunization: Secondary | ICD-10-CM | POA: Insufficient documentation

## 2019-11-11 NOTE — Progress Notes (Signed)
   Covid-19 Vaccination Clinic  Name:  [name]    MRN: [id number] DOB: 1941-01-04  11/11/2019  [name] was observed post Covid-19 immunization for 15 minutes without incidence. She was provided with Vaccine Information Sheet and instruction to access the V-Safe system.   [name] was instructed to call 911 with any severe reactions post vaccine: Marland Kitchen Difficulty breathing  . Swelling of your face and throat  . A fast heartbeat  . A bad rash all over your body  . Dizziness and weakness    Immunizations Administered    Name Date Dose VIS Date Route   Pfizer COVID-19 Vaccine 11/11/2019 12:33 PM 0.3 mL 09/08/2019 Intramuscular   Manufacturer: [place]   Lot: [id number]   [place]: [id number]

## 2020-01-04 ENCOUNTER — Other Ambulatory Visit: Payer: Self-pay | Admitting: Internal Medicine

## 2020-01-04 DIAGNOSIS — I6521 Occlusion and stenosis of right carotid artery: Secondary | ICD-10-CM

## 2020-01-10 ENCOUNTER — Ambulatory Visit: Payer: Medicare Other

## 2020-01-10 ENCOUNTER — Other Ambulatory Visit: Payer: Self-pay

## 2020-01-10 DIAGNOSIS — I6521 Occlusion and stenosis of right carotid artery: Secondary | ICD-10-CM

## 2020-01-16 ENCOUNTER — Other Ambulatory Visit: Payer: Self-pay | Admitting: Internal Medicine

## 2020-02-05 ENCOUNTER — Other Ambulatory Visit: Payer: Self-pay

## 2020-02-05 ENCOUNTER — Encounter: Payer: Self-pay | Admitting: Cardiology

## 2020-02-05 ENCOUNTER — Ambulatory Visit: Payer: Medicare Other | Admitting: Cardiology

## 2020-02-05 VITALS — BP 160/80 | HR 89 | Temp 97.8°F | Resp 16 | Ht 59.0 in | Wt 141.0 lb

## 2020-02-05 DIAGNOSIS — I340 Nonrheumatic mitral (valve) insufficiency: Secondary | ICD-10-CM

## 2020-02-05 DIAGNOSIS — I350 Nonrheumatic aortic (valve) stenosis: Secondary | ICD-10-CM

## 2020-02-05 DIAGNOSIS — E7800 Pure hypercholesterolemia, unspecified: Secondary | ICD-10-CM

## 2020-02-05 DIAGNOSIS — I10 Essential (primary) hypertension: Secondary | ICD-10-CM

## 2020-02-05 NOTE — Progress Notes (Signed)
Primary Physician/Referring:  Prince Solian, MD  Patient ID: [name] Hamilton, female    DOB: [birth year], 79 y.o.   MRN: [id number]  Chief Complaint  Patient presents with  . Hypertension  . Follow-up    1 year   HPI:    [name] Hamilton  is a 79 y.o. Asian [place] female with a history of non-rheumatic Mitral regurgitation, hypertension, hyperlipidemia, and hyperglycemia. She presents for follow-up and management of HTN and valvualar heart disease.  Last echocardiogram on 12/20/2015 which had revealed low normal LVEF at 50-55% with grade 2 diastolic dysfunction, Mild AS and mild to moderate MR. Nuclear stress test on 01/27/2016 revealed no evidence of ischemia with normal LVEF.  In 2018, she was noted to have right ICA stenosis by CT angiogram, hence underwent repeat carotid artery duplex recently and presents for follow-up.  No symptoms of TIA.  She has noticed her blood pressure to be very well controlled and brings home blood pressure recordings and also her blood pressure cuff for cross checking with our office staff.  States that blood pressure remains high during physician visits.  No chest pain or palpitations.  Past Medical History:  Diagnosis Date  . Anemia    with knee replacement  . Anxiety    depression at times  . Aortic valve sclerosis    mild AV calcification, very mild AS (peak grad 18, mean grad 9, AVA 1.02 cm) 11/2015 [facility] CV)  . Arthritis   . AVM (arteriovenous malformation)       . Chest pain    Nuclear, 2005, normal  . Ejection fraction    EF 55-60%, echo, September, 2013  . Fractured coccyx ([place])   . GERD (gastroesophageal reflux disease)   . Hearing loss of left ear   . History of hiatal hernia    "mild one"  . HTN (hypertension)   . Hypercholesteremia   . Mitral regurgitation    mild-mod MR 11/2015 echo [facility] CV)  . Pneumonia 1967   hx of   Past Surgical History:  Procedure Laterality Date  . cataract surgery      bilateral   .  CHOLECYSTECTOMY    . lumbar spinal stenosis surgery without any complications    . REVERSE SHOULDER ARTHROPLASTY Right 11/23/2017   Procedure: TOTAL REVERSE SHOULDER ARTHROPLASTY;  Surgeon: Marchia Bond, MD;  Location: [place];  Service: Orthopedics;  Laterality: Right;  . TENDON REPAIR Left 2013   in forarm  . TONSILLECTOMY    . TOTAL HIP ARTHROPLASTY Right 02/18/2016   Procedure: RIGHT TOTAL HIP ARTHROPLASTY ANTERIOR APPROACH;  Surgeon: Paralee Cancel, MD;  Location: [facility];  Service: Orthopedics;  Laterality: Right;  . TOTAL KNEE ARTHROPLASTY Right 2010   Dr. Cay Schillings  . TOTAL KNEE ARTHROPLASTY Left 02/16/2013   Procedure: LEFT TOTAL KNEE ARTHROPLASTY;  Surgeon: Tobi Bastos, MD;  Location: [facility];  Service: Orthopedics;  Laterality: Left;   Family History  Problem Relation Age of Onset  . Heart attack Father   . Cancer Mother     Social History   Tobacco Use  . Smoking status: Never Smoker  . Smokeless tobacco: Never Used  Substance Use Topics  . Alcohol use: No    Alcohol/week: 0.0 standard drinks    Comment: socially   Marital Status: Married  ROS  Review of Systems  Constitution: Negative for weight gain.  Cardiovascular: Negative for dyspnea on exertion, leg swelling and syncope.  Respiratory: Negative for shortness of breath.   Musculoskeletal: Positive  for back pain and joint pain. Negative for joint swelling.  Gastrointestinal: Positive for change in bowel habit and constipation.   Objective  Blood pressure (!) 160/80, pulse 89, temperature 97.8 F (36.6 C), temperature source Temporal, resp. rate 16, height '4\' 11"'  (1.499 m), weight 141 lb (64 kg).  Vitals with BMI 02/05/2020 02/05/2020 09/07/2019  Height - '4\' 11"'  '4\' 11"'   Weight - 141 lbs 142 lbs 3 oz  BMI - 63.14 97.02  Systolic 637 858 850  Diastolic 80 95 70  Pulse - 89 68     Physical Exam  Constitutional: She appears well-developed and well-nourished. No distress.  Cardiovascular: Normal rate, regular  rhythm and intact distal pulses. Exam reveals no gallop.  Murmur heard.  Midsystolic murmur is present with a grade of 2/6 at the apex radiating to the axilla. No leg edema. No JVD.    Pulmonary/Chest: Effort normal and breath sounds normal. No accessory muscle usage. No respiratory distress.  Abdominal: Soft.   Laboratory examination:   No results for input(s): NA, K, CL, CO2, GLUCOSE, BUN, CREATININE, CALCIUM, GFRNONAA, GFRAA in the last 8760 hours. CrCl cannot be calculated (Patient's most recent lab result is older than the maximum 21 days allowed.).  CMP Latest Ref Rng & Units 11/24/2017 11/10/2017 02/20/2016  Glucose 65 - 99 mg/dL 131(H) 106(H) 198(H)  BUN 6 - 20 mg/dL 26(H) 18 15  Creatinine 0.44 - 1.00 mg/dL 1.32(H) 0.99 0.74  Sodium 135 - 145 mmol/L 135 139 135  Potassium 3.5 - 5.1 mmol/L 3.7 3.8 3.7  Chloride 101 - 111 mmol/L 101 103 104  CO2 22 - 32 mmol/L '26 24 26  ' Calcium 8.9 - 10.3 mg/dL 8.8(L) 10.2 8.3(L)  Total Protein 6.0 - 8.3 g/dL - - -  Total Bilirubin 0.3 - 1.2 mg/dL - - -  Alkaline Phos 39 - 117 U/L - - -  AST 0 - 37 U/L - - -  ALT 0 - 35 U/L - - -   CBC Latest Ref Rng & Units 11/24/2017 11/10/2017 02/20/2016  WBC 4.0 - 10.5 K/uL 8.1 5.3 7.6  Hemoglobin 12.0 - 15.0 g/dL 10.1(L) 13.2 9.6(L)  Hematocrit 36.0 - 46.0 % 30.2(L) 40.6 28.9(L)  Platelets 150 - 400 K/uL 175 193 146(L)   Lipid Panel  No results found for: CHOL, TRIG, HDL, CHOLHDL, VLDL, LDLCALC, LDLDIRECT HEMOGLOBIN A1C No results found for: HGBA1C, MPG TSH No results for input(s): TSH in the last 8760 hours.  External labs:   A1C 5.700 % 01/01/2020  06/23/2019: Htc 40.6, Hb 13.1, WBC 5.04,  Na 142, ALT 13, AST 20.   Lipid Panel completed 06/23/2019 HDL 58 MG/DL 06/23/2019 LDL 79.000 mg 06/23/2019 Cholesterol, total 150.000 m 06/23/2019 Triglycerides 67.000 06/23/2019   Glucose Random 109.000 m 06/23/2019 MicroAlbumin Urine 19.000 06/23/2019 MicroAlbumin/Creat 26.4 MG/ 06/23/2019 BUN 12.000 mg  06/23/2019 Creatinine, Serum 0.800 mg/ 06/23/2019  TSH 1.590 micr 06/23/2019  FOBT: Normal 06/26/2019  Vitamin D, 25 51.200 ng/mL  06/23/2019  01/15/2017: Creatinine 0.7, EGFR 82.3, potassium 3.6, CMP normal. CBC normal. Cholesterol 158, triglycerides 88, HDL 58, LDL 82. TSH 1.1. Hemoglobin A1c 5.5%.  04/10/2016: Urine analysis normal, potassium 3.6, BUN 10, serum creatinine 0.7, eGFR 82 mL. CMP otherwise normal. CBC normal, platelets 213. Total cholesterol 158, triglycerides 88, HDL 58, LDL 82. TSH normal, HbA1c 5.5%.  Medications and allergies   Allergies  Allergen Reactions  . Adhesive [Tape] Rash  . Latex Rash     Current Outpatient Medications  Medication Instructions  .  aspirin EC 81 mg, Oral, Daily with supper  . atenolol (TENORMIN) 50 MG tablet Take 1 tablet by mouth  every morning.   HOLD if BP less then 120/80.  Marland Kitchen atorvastatin (LIPITOR) 20 mg, Oral, Daily at bedtime  . Cholecalciferol (VITAMIN D-3 PO) 1 tablet, Oral, Daily  . fluticasone (FLONASE) 50 MCG/ACT nasal spray Daily PRN  . loratadine (CLARITIN) 10 mg, Oral, Daily PRN  . quinapril (ACCUPRIL) 20 MG tablet Take 1 tablet by mouth  Daily.   HOLD if BP less than 120/80.  Marland Kitchen triamterene-hydrochlorothiazide (MAXZIDE-25) 37.5-25 MG tablet Take 1 tablet by mouth  every morning  . zolpidem (AMBIEN) 5 mg, Oral, At bedtime PRN   Radiology:   CT of the abdomen 05/06/2011: had revealed faint coronary artery calcification and mitral annular calcification.  CT angiogram extracranial vessel 03/02/2017: 1. CTA head and neck is negative for aneurysm. 2.  Normal for age CT appearance of the brain. 3. There is atherosclerosis resulting in mild to moderate stenosis at the right ICA bulb (50-60%), the right vertebral artery origin, and the left vertebral artery V4 segment. 4. No other extra- or intracranial stenosis. Mild generalized arterial tortuosity.  Cardiac Studies:   Nuclear stress test 01/27/2016: 1. Resting EKG demonstrated  normal sinus rhythm, normal axis. Low voltage complexes, poor R-wave progression. Stress EKG is nondiagnostic for ischemia as it is a pharmacologic stress test. Stress symptoms included dizziness. 2. Myocardial perfusion imaging is normal. Overall left ventricular systolic function was normal without regional wall motion abnormalities. The left ventricular ejection fraction was 57%.  Echocardiogram 12/20/2015: 1. Left ventricle cavity is normal in size. No wall motional abnormalities. Low normal LVEF of 50-55%. Doppler evidence of grade II (pseudonormal) diastolic dysfunction. Presence of mitral annular calcification may give false diastoli values.  Diastolic dysfunction findings suggests elevated LA/LV end diastolic pressure.  2. Left atrial cavity is normal in size. Interatrial septum bulges to the right suggests elevated left atrial pressure. 3. Mild calcification of the aortic valve annulus. Mild aortic valve leaflet calcification. Mildly restricted aortic valve leaflets. Mild aortic valve stenosis. Aortic valve peak pressure gradient of  18  and mean gradient of 9  mmHg, consistent with very mild aortic stenosis. Calculated aortic valve area    1.02 cm. Trace aortic regurgitation. 4. Mild calcification of the mitral valve annulus. Mild to moderate mitral regurgitation. 5. Mild tricuspid regurgitation. No evidence of pulmonary hypertension. 6. Mild pulmonic regurgitation.  Carotid artery duplex 01/10/2020:  Minimal stenosis in the right internal carotid artery (minimal) with heterogeneous plaque.  Minimal stenosis in the left internal carotid artery (1-15%) with heterogeneous plaque.  Antegrade right vertebral artery flow. Antegrade left vertebral artery Flow.  Follow up studies when clinically indicated.   EKG  EKG 01/2020: Normal sinus rhythm at rate of 73 bpm, normal axis, poor R wave progression, probably normal variant.  Low-voltage complexes.   No significant change from  01/31/2018.  Assessment     ICD-10-CM   1. Coronary artery calcification seen on CT scan  I25.10 EKG 12-Lead  2. Benign hypertension  I10 PCV ECHOCARDIOGRAM COMPLETE  3. Mild aortic stenosis  I35.0 PCV ECHOCARDIOGRAM COMPLETE  4. Mild mitral regurgitation  I34.0 PCV ECHOCARDIOGRAM COMPLETE  5. Hypercholesteremia  E78.00     No orders of the defined types were placed in this encounter.   There are no discontinued medications.  Recommendations:   Mrs Ishita Mcnerney is a 79 y.o.  Asian [place] female with a history of non-rheumatic Mitral regurgitation,  hypertension, hyperlipidemia, and hyperglycemia. She presents for follow-up and management of HTN and valvualar heart disease.    She is presently doing well, no significant change in physical exam, MR murmur appears more prominent than previous. Blood pressure is perfectly well controlled at home and her blood pressure cuff at home and hours did match.  I suspect she has significant whitecoat hypertension hence no changes were done today.  As it has been greater than 4 years since last echocardiogram, I will repeat echocardiogram.  I also reviewed her CT angiogram of the neck which had shown a 50% stenosis in the right carotid bulb, recent carotid duplex does not reveal any significant abnormality.  She is already on a statin, and reflexes are well controlled and she is also on low-dose aspirin.  Continue the same.  With regard to constipation, advised her to use Metamucil on a daily basis which will improve both lipids and also control of hypertension along with hopefully improving her bowel habits. Hence I did not make any changes today. I'll see her back on annual basis. She'll contact me if her blood pressure is elevated.  Adrian Prows, MD, [facility] 02/05/2020, 3:22 PM [facility] [place] Pager: [phone] Office: [phone]

## 2020-07-09 ENCOUNTER — Encounter: Payer: Self-pay | Admitting: Obstetrics & Gynecology

## 2020-11-22 ENCOUNTER — Ambulatory Visit: Payer: Medicare Other

## 2020-12-26 ENCOUNTER — Ambulatory Visit: Payer: Medicare Other

## 2020-12-26 ENCOUNTER — Other Ambulatory Visit: Payer: Self-pay

## 2020-12-26 DIAGNOSIS — I340 Nonrheumatic mitral (valve) insufficiency: Secondary | ICD-10-CM

## 2020-12-26 DIAGNOSIS — I10 Essential (primary) hypertension: Secondary | ICD-10-CM

## 2020-12-26 DIAGNOSIS — I350 Nonrheumatic aortic (valve) stenosis: Secondary | ICD-10-CM

## 2020-12-27 ENCOUNTER — Other Ambulatory Visit: Payer: Medicare Other

## 2021-02-04 ENCOUNTER — Ambulatory Visit: Payer: Medicare Other | Admitting: Cardiology

## 2021-02-04 ENCOUNTER — Encounter: Payer: Self-pay | Admitting: Cardiology

## 2021-02-04 ENCOUNTER — Other Ambulatory Visit: Payer: Self-pay

## 2021-02-04 VITALS — BP 140/82 | HR 66 | Temp 98.4°F | Resp 17 | Ht 59.0 in | Wt 140.4 lb

## 2021-02-04 DIAGNOSIS — I10 Essential (primary) hypertension: Secondary | ICD-10-CM

## 2021-02-04 DIAGNOSIS — I340 Nonrheumatic mitral (valve) insufficiency: Secondary | ICD-10-CM

## 2021-02-04 DIAGNOSIS — E7800 Pure hypercholesterolemia, unspecified: Secondary | ICD-10-CM

## 2021-02-04 NOTE — Progress Notes (Signed)
Primary Physician/Referring:  Prince Solian, MD  Patient ID: [name] Hamilton, female    DOB: [birth year], 80 y.o.   MRN: [id number]  Chief Complaint  Patient presents with  . Aortic Stenosis  . Hyperlipidemia  . Hypertension   HPI:    [name] Hamilton  is a 80 y.o. [place] female with a history of non-rheumatic mild mitral regurgitation and aortic stenosis, hypertension, hyperlipidemia, and hyperglycemia. She presents for follow-up and management of HTN and valvualar heart disease.    She remains asymptomatic. She has noticed her blood pressure to be very well controlled and brings home blood pressure recordings.  States that blood pressure remains high during physician visits.  No chest pain or palpitations.  Past Medical History:  Diagnosis Date  . Anemia    with knee replacement  . Anxiety    depression at times  . Aortic valve sclerosis    mild AV calcification, very mild AS (peak grad 18, mean grad 9, AVA 1.02 cm) 11/2015 [facility] CV)  . Arthritis   . AVM (arteriovenous malformation)       . Chest pain    Nuclear, 2005, normal  . Ejection fraction    EF 55-60%, echo, September, 2013  . Fractured coccyx ([place])   . GERD (gastroesophageal reflux disease)   . Hearing loss of left ear   . History of hiatal hernia    "mild one"  . HTN (hypertension)   . Hypercholesteremia   . Mitral regurgitation    mild-mod MR 11/2015 echo [facility] CV)  . Pneumonia 1967   hx of   Past Surgical History:  Procedure Laterality Date  . cataract surgery      bilateral   . CHOLECYSTECTOMY    . lumbar spinal stenosis surgery without any complications    . REVERSE SHOULDER ARTHROPLASTY Right 11/23/2017   Procedure: TOTAL REVERSE SHOULDER ARTHROPLASTY;  Surgeon: Marchia Bond, MD;  Location: [place];  Service: Orthopedics;  Laterality: Right;  . TENDON REPAIR Left 2013   in forarm  . TONSILLECTOMY    . TOTAL HIP ARTHROPLASTY Right 02/18/2016   Procedure: RIGHT TOTAL HIP  ARTHROPLASTY ANTERIOR APPROACH;  Surgeon: Paralee Cancel, MD;  Location[facility];  Service: Orthopedics;  Laterality: Right;  . TOTAL KNEE ARTHROPLASTY Right 2010   Dr. Cay Schillings  . TOTAL KNEE ARTHROPLASTY Left 02/16/2013   Procedure: LEFT TOTAL KNEE ARTHROPLASTY;  Surgeon: Tobi Bastos, MD;  Location[facility];  Service: Orthopedics;  Laterality: Left;   Family History  Problem Relation Age of Onset  . Heart attack Father   . Cancer Mother   . Rheum arthritis Sister   . Alzheimer's disease Brother     Social History   Tobacco Use  . Smoking status: Never Smoker  . Smokeless tobacco: Never Used  Substance Use Topics  . Alcohol use: No    Alcohol/week: 0.0 standard drinks    Comment: socially   Marital Status: Married  ROS  Review of Systems  Cardiovascular: Negative for dyspnea on exertion, leg swelling and syncope.  Respiratory: Negative for shortness of breath.   Musculoskeletal: Negative for joint swelling.  Gastrointestinal: Positive for constipation.   Objective  Blood pressure 140/82, pulse 66, temperature 98.4 F (36.9 C), temperature source Temporal, resp. rate 17, height '4\' 11"'  (1.499 m), weight 140 lb 6.4 oz (63.7 kg), SpO2 100 %.  Vitals with BMI 02/04/2021 02/05/2020 02/05/2020  Height '4\' 11"'  - '4\' 11"'   Weight 140 lbs 6 oz - 141 lbs  BMI 30.86 - 57.84  Systolic 696 295 284  Diastolic 82 80 95  Pulse 66 - 89     Physical Exam Constitutional:      General: She is not in acute distress.    Appearance: She is well-developed.  Cardiovascular:     Rate and Rhythm: Normal rate and regular rhythm.     Pulses: Intact distal pulses.     Heart sounds: Murmur heard.   Midsystolic murmur is present with a grade of 2/6 at the apex radiating to the axilla. No gallop.      Comments: No leg edema. No JVD.   Pulmonary:     Effort: Pulmonary effort is normal. No accessory muscle usage or respiratory distress.     Breath sounds: Normal breath sounds.  Abdominal:      Palpations: Abdomen is soft.    Laboratory examination:   External labs:   Labs 01/28/2021:  Serum glucose 92 mg, BUN 17, creatinine 0.8, EGFR 69/84 mL, CMP normal.  Hb 14.2/HCT 42.1, platelets 177, normal indicis.  Total cholesterol 172, triglycerides 80, HDL 66, LDL 90.  Non-HDL cholesterol 106.  TSH normal at 2.35, vitamin D normal at 54.  04/10/2016: Urine analysis normal, potassium 3.6, BUN 10, serum creatinine 0.7, eGFR 82 mL. CMP otherwise normal. CBC normal, platelets 213. Total cholesterol 158, triglycerides 88, HDL 58, LDL 82. TSH normal, HbA1c 5.5%.  Medications and allergies   Allergies  Allergen Reactions  . Adhesive [Tape] Rash  . Latex Rash    Current Outpatient Medications on File Prior to Visit  Medication Sig Dispense Refill  . Ascorbic Acid (VITAMIN C) 500 MG CHEW Chew 1 tablet by mouth daily.    Marland Kitchen aspirin EC 81 MG tablet Take 81 mg by mouth daily with supper.    Marland Kitchen atenolol (TENORMIN) 50 MG tablet Take 1 tablet by mouth  every morning.   HOLD if BP less then 120/80. (Patient taking differently: Take 50 mg by mouth at bedtime. HOLD if BP less then 120/80.) 90 tablet 0  . atorvastatin (LIPITOR) 20 MG tablet Take 1 tablet (20 mg total) by mouth at bedtime. (Patient taking differently: Take 20 mg by mouth daily with supper.) 90 tablet 3  . fluticasone (FLONASE) 50 MCG/ACT nasal spray daily as needed.    . loratadine (CLARITIN) 10 MG tablet Take 10 mg by mouth daily as needed for allergies.    Marland Kitchen quinapril (ACCUPRIL) 20 MG tablet Take 1 tablet by mouth  Daily.   HOLD if BP less than 120/80. 90 tablet 0  . triamterene-hydrochlorothiazide (MAXZIDE-25) 37.5-25 MG tablet Take 1 tablet by mouth  every morning 90 tablet 0  . zolpidem (AMBIEN) 10 MG tablet Take 5 mg by mouth at bedtime as needed for sleep.      No current facility-administered medications on file prior to visit.    Radiology:   CT of the abdomen 05/06/2011: had revealed faint coronary artery calcification  and mitral annular calcification.  CT angiogram extracranial vessel 03/02/2017: 1. CTA head and neck is negative for aneurysm. 2.  Normal for age CT appearance of the brain. 3. There is atherosclerosis resulting in mild to moderate stenosis at the right ICA bulb (50-60%), the right vertebral artery origin, and the left vertebral artery V4 segment. 4. No other extra- or intracranial stenosis. Mild generalized arterial tortuosity.  Cardiac Studies:   Nuclear stress test 01/27/2016: 1. Resting EKG demonstrated normal sinus rhythm, normal axis. Low voltage complexes, poor R-wave progression. Stress EKG  is nondiagnostic for ischemia as it is a pharmacologic stress test. Stress symptoms included dizziness. 2. Myocardial perfusion imaging is normal. Overall left ventricular systolic function was normal without regional wall motion abnormalities. The left ventricular ejection fraction was 57%.   Carotid artery duplex 01/10/2020:  Minimal stenosis in the right internal carotid artery (minimal) with heterogeneous plaque.  Minimal stenosis in the left internal carotid artery (1-15%) with heterogeneous plaque.  Antegrade right vertebral artery flow. Antegrade left vertebral artery Flow.  Follow up studies when clinically indicated.   Echocardiogram 12/26/2020: Normal LV systolic function with visual EF 60-65%. Left ventricle cavity is normal in size. Normal global wall motion. Indeterminate diastolic filling pattern, elevated LAP. Trace aortic regurgitation. Mild (Grade I) mitral regurgitation. Moderate calcification of the mitral valve annulus. Mild tricuspid regurgitation. Mild pulmonary hypertension. RVSP measures 44 mmHg. Mild pulmonic regurgitation. Compared to prior study dated 12/20/2015 no significant change.    EKG  EKG 02/04/2021: Normal sinus rhythm at rate of 64 bpm, normal axis.  Poor R wave progression, probably normal variant.  No evidence of ischemia, normal EKG.  Single PAC.  No  significant change from EKG 01/2020.  Assessment     ICD-10-CM   1. Mild mitral regurgitation  I34.0   2. Benign hypertension  I10 EKG 12-Lead  3. Hypercholesteremia  E78.00     No orders of the defined types were placed in this encounter.   Medications Discontinued During This Encounter  Medication Reason  . Cholecalciferol (VITAMIN D-3 PO) Error    Recommendations:   [name] Hamilton is a 80 y.o.  Asian [place] female with a history of non-rheumatic mild mitral regurgitation, hypertension, hyperlipidemia, and hyperglycemia. She presents for follow-up and management of HTN and valvualar heart disease.   She is presently doing well, no significant change in physical exam.Blood pressure is perfectly well controlled at home.  I suspect she has significant whitecoat hypertension hence no changes were done today. She is already on a statin for carotid atherosclerosis along with ASA 81 mg. Labs within normal limits.    With regard to constipation, advised her to use Metamucil on a daily basis which will improve both lipids and also control of hypertension along with hopefully improving her bowel habits. I will see her back in 2 years.   Adrian Prows, MD, [facility] 02/04/2021, 9:06 PM Office: [phone] Pager: [phone]

## 2021-09-03 ENCOUNTER — Other Ambulatory Visit: Payer: Self-pay

## 2021-09-03 ENCOUNTER — Other Ambulatory Visit (HOSPITAL_COMMUNITY)
Admission: RE | Admit: 2021-09-03 | Discharge: 2021-09-03 | Disposition: A | Discharge status: Home / Self Care | Payer: Medicare Other | Source: Ambulatory Visit | Attending: Obstetrics & Gynecology | Admitting: Obstetrics & Gynecology

## 2021-09-03 ENCOUNTER — Encounter (HOSPITAL_BASED_OUTPATIENT_CLINIC_OR_DEPARTMENT_OTHER): Payer: Self-pay | Admitting: Obstetrics & Gynecology

## 2021-09-03 ENCOUNTER — Ambulatory Visit (INDEPENDENT_AMBULATORY_CARE_PROVIDER_SITE_OTHER): Payer: Medicare Other | Admitting: Obstetrics & Gynecology

## 2021-09-03 VITALS — BP 155/82 | HR 62 | Ht <= 58 in | Wt 139.8 lb

## 2021-09-03 DIAGNOSIS — Z124 Encounter for screening for malignant neoplasm of cervix: Secondary | ICD-10-CM | POA: Diagnosis not present

## 2021-09-03 DIAGNOSIS — Z01419 Encounter for gynecological examination (general) (routine) without abnormal findings: Secondary | ICD-10-CM | POA: Diagnosis not present

## 2021-09-03 DIAGNOSIS — I10 Essential (primary) hypertension: Secondary | ICD-10-CM

## 2021-09-03 DIAGNOSIS — E7800 Pure hypercholesterolemia, unspecified: Secondary | ICD-10-CM

## 2021-09-03 NOTE — Progress Notes (Signed)
80 y.o. G3P2 Married Other or two or more races female here for breast and pelvic exam.  Denies vaginal bleeding. She desires having a pap smear today.  Guidelines reviewed.  She's doing well.    No LMP recorded. Patient is postmenopausal.          Sexually active: No.  H/O STD:  no  Health Maintenance: PCP:  Dr. Dagmar Hait.  Last wellness appt was 07/25/2021.  Did blood work at that appt:  yes Vaccines are up to date:  Reviewed with pt.  She is sure  Colonoscopy:  2012.  Follow up not recommended.  She just saw Dr. Collene Mares. MMG:  07/01/2020 Negative.  Has scheduled later this year.  BMD:  2018 with Dr. Dagmar Hait Last pap smear:  05/09/2018 Negative.   H/o abnormal pap smear:  no    reports that she has never smoked. She has never used smokeless tobacco. She reports that she does not drink alcohol and does not use drugs.  Past Medical History:  Diagnosis Date   Anemia    with knee replacement   Anxiety    depression at times   Aortic valve sclerosis    mild AV calcification, very mild AS (peak grad 18, mean grad 9, AVA 1.02 cm) 11/2015 ([facility])   Arthritis    AVM (arteriovenous malformation)        Chest pain    Nuclear, 2005, normal   Ejection fraction    EF 55-60%, echo, September, 2013   Fractured coccyx [facility])    GERD (gastroesophageal reflux disease)    Hearing loss of left ear    History of hiatal hernia    "mild one"   HTN (hypertension)    Hypercholesteremia    Mitral regurgitation    mild-mod MR 11/2015 echo [facility])   Pneumonia 1967   hx of    Past Surgical History:  Procedure Laterality Date   cataract surgery      bilateral    CHOLECYSTECTOMY     lumbar spinal stenosis surgery without any complications     REVERSE SHOULDER ARTHROPLASTY Right 11/23/2017   Procedure: TOTAL REVERSE SHOULDER ARTHROPLASTY;  Surgeon: Marchia Bond, MD;  Location: [place];  Service: Orthopedics;  Laterality: Right;   TENDON REPAIR Left 2013   in forarm   TONSILLECTOMY     TOTAL  HIP ARTHROPLASTY Right 02/18/2016   Procedure: RIGHT TOTAL HIP ARTHROPLASTY ANTERIOR APPROACH;  Surgeon: Paralee Cancel, MD;  [facility];  Service: Orthopedics;  Laterality: Right;   TOTAL KNEE ARTHROPLASTY Right 2010   Dr. Cay Schillings   TOTAL KNEE ARTHROPLASTY Left 02/16/2013   Procedure: LEFT TOTAL KNEE ARTHROPLASTY;  Surgeon: Tobi Bastos, MD;  [facility];  Service: Orthopedics;  Laterality: Left;    Current Outpatient Medications  Medication Sig Dispense Refill   Ascorbic Acid (VITAMIN C) 500 MG CHEW Chew 1 tablet by mouth daily.     aspirin EC 81 MG tablet Take 81 mg by mouth daily with supper.     atenolol (TENORMIN) 50 MG tablet Take 1 tablet by mouth  every morning.   HOLD if BP less then 120/80. (Patient taking differently: Take 50 mg by mouth at bedtime. HOLD if BP less then 120/80.) 90 tablet 0   atorvastatin (LIPITOR) 20 MG tablet Take 1 tablet (20 mg total) by mouth at bedtime. (Patient taking differently: Take 20 mg by mouth daily with supper.) 90 tablet 3   fluticasone (FLONASE) 50 MCG/ACT nasal spray daily  as needed.     loratadine (CLARITIN) 10 MG tablet Take 10 mg by mouth daily as needed for allergies.     quinapril (ACCUPRIL) 20 MG tablet Take 1 tablet by mouth  Daily.   HOLD if BP less than 120/80. 90 tablet 0   triamterene-hydrochlorothiazide (MAXZIDE-25) 37.5-25 MG tablet Take 1 tablet by mouth  every morning 90 tablet 0   zolpidem (AMBIEN) 10 MG tablet Take 5 mg by mouth at bedtime as needed for sleep.      No current facility-administered medications for this visit.    Family History  Problem Relation Age of Onset   Heart attack Father    Cancer Mother    Rheum arthritis Sister    Alzheimer's disease Brother     Review of Systems  All other systems reviewed and are negative.  Exam:   BP (!) 155/82 (BP Location: Right Arm, Patient Position: Sitting, Cuff Size: Normal)   Pulse 62   Ht 4\' 10"  (1.473 m)   Wt 139 lb 12.8 oz (63.4 kg)   BMI 29.22  kg/m   Height: 4\' 10"  (147.3 cm)  General appearance: alert, cooperative and appears stated age Breasts: normal appearance, no masses or tenderness Abdomen: soft, non-tender; bowel sounds normal; no masses,  no organomegaly Lymph nodes: Cervical, supraclavicular, and axillary nodes normal.  No abnormal inguinal nodes palpated Neurologic: Grossly normal  Pelvic: External genitalia:  no lesions              Urethra:  normal appearing urethra with no masses, tenderness or lesions              Bartholins and Skenes: normal                 Vagina: normal appearing vagina with atrophic changes and no discharge, no lesions              Cervix: no lesions              Pap taken: Yes.   Bimanual Exam:  Uterus:  normal size, contour, position, consistency, mobility, non-tender              Adnexa: normal adnexa and no mass, fullness, tenderness               Rectovaginal: Confirms               Anus:  normal sphincter tone, no lesions  Chaperone, Octaviano Batty, CMA, was present for exam.  Assessment/Plan: 1. Encntr for gyn exam (general) (routine) w/o abn findings - pap smear obtained today - MMG is due.  Last done 07/01/2020 - BMD with Dr. Elsworth Soho - colonoscopy 2012.  Dr. Collene Mares did not recommend additional screening - lab work done with Dr. Dagmar Hait - vaccines reviewed and updated - pt is going to return in 2 years for exam.  Will call with any new problems/concerns prior to that time.  2. Hypercholesterolemia  3. Primary hypertension

## 2021-09-04 LAB — CYTOLOGY - PAP: Diagnosis: NEGATIVE

## 2021-09-11 ENCOUNTER — Ambulatory Visit (HOSPITAL_BASED_OUTPATIENT_CLINIC_OR_DEPARTMENT_OTHER): Payer: Medicare Other | Admitting: Obstetrics & Gynecology

## 2021-10-23 ENCOUNTER — Encounter (HOSPITAL_BASED_OUTPATIENT_CLINIC_OR_DEPARTMENT_OTHER): Payer: Self-pay | Admitting: *Deleted

## 2022-10-23 ENCOUNTER — Encounter: Payer: Self-pay | Admitting: Cardiology

## 2022-10-24 NOTE — Progress Notes (Signed)
-  ve cologaurd

## 2022-11-09 ENCOUNTER — Encounter (HOSPITAL_BASED_OUTPATIENT_CLINIC_OR_DEPARTMENT_OTHER): Payer: Self-pay | Admitting: Obstetrics & Gynecology

## 2022-11-09 ENCOUNTER — Ambulatory Visit (HOSPITAL_BASED_OUTPATIENT_CLINIC_OR_DEPARTMENT_OTHER): Payer: Medicare Other | Admitting: Obstetrics & Gynecology

## 2022-11-09 VITALS — BP 133/75 | HR 75 | Ht 59.0 in | Wt 122.4 lb

## 2022-11-09 DIAGNOSIS — N764 Abscess of vulva: Secondary | ICD-10-CM | POA: Diagnosis not present

## 2022-11-09 DIAGNOSIS — R3915 Urgency of urination: Secondary | ICD-10-CM | POA: Diagnosis not present

## 2022-11-09 DIAGNOSIS — N390 Urinary tract infection, site not specified: Secondary | ICD-10-CM | POA: Diagnosis not present

## 2022-11-09 NOTE — Patient Instructions (Signed)
Urodynamics  Urogynecologist:  Dr. Chancy Milroy

## 2022-11-09 NOTE — Progress Notes (Addendum)
GYNECOLOGY  VISIT  CC:   recent UTI treatment  HPI: 82 y.o. G3P2 Married Other or two or more races female here for concerns about possible infection.  Reports she had a little blood in her urine about three months ago.  She was treated with antibiotics.  She had follow up and there was still abnormal findings.  Was treated for a month with abx.  Would like to leave sample.  Now not having dysuria but still a lot of urgency.  Doesn't leak much.  Feel likely some of this is related to menopausal changes but will repeat testing today.  If negative, she may benefit from urogyn evaluation.     Also has tender area on vulva she would like me to look at today.  Denies vaginal bleeding.   Last MMG 10/23/2021.  Is having personal stressors as husband has been diagnosed with cancer.  She is helping with his care.   Past Medical History:  Diagnosis Date   Anemia    with knee replacement   Anxiety    depression at times   Aortic valve sclerosis    mild AV calcification, very mild AS (peak grad 18, mean grad 9, AVA 1.02 cm) 11/2015 ([facility])   Arthritis    AVM (arteriovenous malformation)        Chest pain    Nuclear, 2005, normal   Ejection fraction    EF 55-60%, echo, September, 2013   Fractured coccyx [facility])    GERD (gastroesophageal reflux disease)    Hearing loss of left ear    History of hiatal hernia    "mild one"   HTN (hypertension)    Hypercholesteremia    Mitral regurgitation    mild-mod MR 11/2015 echo [facility])   Pneumonia 1967   hx of    MEDS:   Current Outpatient Medications on File Prior to Visit  Medication Sig Dispense Refill   aspirin EC 81 MG tablet Take 81 mg by mouth daily with supper.     atenolol (TENORMIN) 50 MG tablet Take 1 tablet by mouth  every morning.   HOLD if BP less then 120/80. (Patient taking differently: Take 50 mg by mouth at bedtime. HOLD if BP less then 120/80.) 90 tablet 0   atorvastatin (LIPITOR) 20 MG tablet Take 1 tablet (20 mg total)  by mouth at bedtime. (Patient taking differently: Take 20 mg by mouth daily with supper.) 90 tablet 3   benazepril (LOTENSIN) 20 MG tablet Take 20 mg by mouth daily.     fluticasone (FLONASE) 50 MCG/ACT nasal spray daily as needed.     loratadine (CLARITIN) 10 MG tablet Take 10 mg by mouth daily as needed for allergies.     triamterene-hydrochlorothiazide (MAXZIDE-25) 37.5-25 MG tablet Take 1 tablet by mouth  every morning 90 tablet 0   zolpidem (AMBIEN) 10 MG tablet Take 5 mg by mouth at bedtime as needed for sleep.      No current facility-administered medications on file prior to visit.    ALLERGIES: Adhesive [tape] and Latex  SH:  married, non smoker  Review of Systems  Constitutional: Negative.   Genitourinary:  Negative for dysuria and urgency.    PHYSICAL EXAMINATION:    BP 133/75   Pulse 75   Ht 4' 11"$  (1.499 m)   Wt 122 lb 6.4 oz (55.5 kg)   BMI 24.72 kg/m     General appearance: alert, cooperative and appears stated age Lymph:  no inguinal LAD noted  Pelvic: External genitalia:  small erythematous, tender lesion on inferior right labia majora, purulent material drained              Urethra:  normal appearing urethra with no masses, tenderness or lesions              Bartholins and Skenes: normal                 Vagina: normal appearing vagina with normal color and discharge, no lesions              Cervix: no lesions              Bimanual Exam:  Uterus:  normal size, contour, position, consistency, mobility, non-tender              Adnexa: no mass, fullness, tenderness                Assessment/Plan: 1. Urinary tract infection without hematuria, site unspecified - will recheck urine studies - Urine Microscopic - Urine Culture  2. Vulvar abscess - due to size, feel drainage is likely all that will be needed for treatment.  Culture was obtained as well. - WOUND CULTURE  3. Urinary urgency - referral to urogyn placed

## 2022-11-10 LAB — URINALYSIS, MICROSCOPIC ONLY
Bacteria, UA: NONE SEEN
Casts: NONE SEEN /lpf
RBC, Urine: NONE SEEN /hpf (ref 0–2)
WBC, UA: NONE SEEN /hpf (ref 0–5)

## 2022-11-11 LAB — URINE CULTURE

## 2022-11-12 ENCOUNTER — Telehealth (HOSPITAL_BASED_OUTPATIENT_CLINIC_OR_DEPARTMENT_OTHER): Payer: Self-pay

## 2022-11-12 LAB — WOUND CULTURE: Organism ID, Bacteria: NONE SEEN

## 2022-11-12 NOTE — Telephone Encounter (Signed)
Patient states she was suppose to referred to urogyn. She has not heard anything from anybody. Please advise. tbw

## 2022-11-13 NOTE — Addendum Note (Signed)
Addended by: Megan Salon on: 11/13/2022 05:35 AM   Modules accepted: Orders

## 2022-11-16 ENCOUNTER — Encounter: Payer: Self-pay | Admitting: *Deleted

## 2022-12-02 ENCOUNTER — Encounter: Payer: Self-pay | Admitting: Cardiology

## 2022-12-02 ENCOUNTER — Ambulatory Visit: Payer: Medicare Other | Admitting: Cardiology

## 2022-12-02 VITALS — BP 140/80 | HR 70 | Resp 16 | Ht 59.0 in | Wt 123.0 lb

## 2022-12-02 DIAGNOSIS — I071 Rheumatic tricuspid insufficiency: Secondary | ICD-10-CM

## 2022-12-02 DIAGNOSIS — I340 Nonrheumatic mitral (valve) insufficiency: Secondary | ICD-10-CM

## 2022-12-02 DIAGNOSIS — I6523 Occlusion and stenosis of bilateral carotid arteries: Secondary | ICD-10-CM

## 2022-12-02 DIAGNOSIS — I10 Essential (primary) hypertension: Secondary | ICD-10-CM

## 2022-12-02 DIAGNOSIS — E7800 Pure hypercholesterolemia, unspecified: Secondary | ICD-10-CM

## 2022-12-02 NOTE — Progress Notes (Signed)
Primary Physician/Referring:  Prince Solian, MD  Patient ID: [name] Hamilton, female    DOB: 09-04-1941, 82 y.o.   MRN: [id number]  No chief complaint on file.  HPI:    [name] Hamilton  is a 82 y.o. Asian [place] female with a history of non-rheumatic mild mitral regurgitation,  hypertension, hyperlipidemia, and hyperglycemia.  She presents for follow-up and management of HTN and valvualar heart disease.  She called our office stating that she feels like she could hear heart murmur for the last 2 days.  They brought her in to be seen on a semiurgent basis.  She remains asymptomatic without chest pain, shortness of breath, dizziness or syncope.  Denies any fever or chills.   Past Medical History:  Diagnosis Date  . Anemia    with knee replacement  . Anxiety    depression at times  . Aortic valve sclerosis    mild AV calcification, very mild AS (peak grad 18, mean grad 9, AVA 1.02 cm) 11/2015 [facility])  . Arthritis   . AVM (arteriovenous malformation)       . Chest pain    Nuclear, 2005, normal  . Ejection fraction    EF 55-60%, echo, September, 2013  . Fractured coccyx ([place])   . GERD (gastroesophageal reflux disease)   . Hearing loss of left ear   . History of hiatal hernia    "mild one"  . HTN (hypertension)   . Hypercholesteremia   . Mitral regurgitation    mild-mod MR 11/2015 echo [facility])  . Pneumonia 1967   hx of   Past Surgical History:  Procedure Laterality Date  . cataract surgery      bilateral   . CHOLECYSTECTOMY    . lumbar spinal stenosis surgery without any complications    . REVERSE SHOULDER ARTHROPLASTY Right 11/23/2017   Procedure: TOTAL REVERSE SHOULDER ARTHROPLASTY;  Surgeon: Marchia Bond, MD;  Location: [place];  Service: Orthopedics;  Laterality: Right;  . TENDON REPAIR Left 2013   in forarm  . TONSILLECTOMY    . TOTAL HIP ARTHROPLASTY Right 02/18/2016   Procedure: RIGHT TOTAL HIP ARTHROPLASTY ANTERIOR APPROACH;  Surgeon: Paralee Cancel,  MD;  [facility];  Service: Orthopedics;  Laterality: Right;  . TOTAL KNEE ARTHROPLASTY Right 2010   Dr. Cay Schillings  . TOTAL KNEE ARTHROPLASTY Left 02/16/2013   Procedure: LEFT TOTAL KNEE ARTHROPLASTY;  Surgeon: Tobi Bastos, MD;  [facility];  Service: Orthopedics;  Laterality: Left;   Family History  Problem Relation Age of Onset  . Heart attack Father   . Cancer Mother   . Rheum arthritis Sister   . Alzheimer's disease Brother     Social History   Tobacco Use  . Smoking status: Never  . Smokeless tobacco: Never  Substance Use Topics  . Alcohol use: No    Alcohol/week: 0.0 standard drinks of alcohol    Comment: socially   Marital Status: Married  ROS  Review of Systems  Cardiovascular:  Negative for chest pain, dyspnea on exertion and leg swelling.   Objective  Blood pressure (!) 140/80, pulse 70, resp. rate 16, height '4\' 11"'$  (1.499 m), weight 123 lb (55.8 kg), SpO2 98 %.     12/02/2022   10:56 AM 11/09/2022   11:28 AM 09/03/2021    3:12 PM  Vitals with BMI  Height '4\' 11"'$  '4\' 11"'$  '4\' 10"'$   Weight 123 lbs 122 lbs 6 oz 139 lbs 13 oz  BMI 24.83 24.71 29.23  Systolic [id number]  Diastolic 80 75 82  Pulse 70 75 62     Physical Exam Constitutional:      Appearance: She is well-developed.  Neck:     Vascular: No carotid bruit or JVD.  Cardiovascular:     Rate and Rhythm: Normal rate and regular rhythm.     Pulses: Normal pulses and intact distal pulses.     Heart sounds: Murmur heard.     Midsystolic murmur is present with a grade of 3/6 at the apex radiating to the axilla.     No gallop.     Comments:   Pulmonary:     Effort: Pulmonary effort is normal. No accessory muscle usage or respiratory distress.     Breath sounds: Normal breath sounds.  Abdominal:     General: Bowel sounds are normal.     Palpations: Abdomen is soft.  Musculoskeletal:     Right lower leg: No edema.     Left lower leg: No edema.   Laboratory examination:   External labs:    Cholesterol, total 155.000 m 04/07/2022 HDL 80.000 mg 04/07/2022 LDL 61.000 mg 04/07/2022 Triglycerides 69.000 mg 04/07/2022  A1C 5.800 % 04/07/2022 TSH 1.560 04/07/2022  Creatinine, Serum 0.800 mg/ 06/23/2019 Potassium 4.200 mEq 04/07/2022 ALT (SGPT) 17.000 IU/ 04/07/2022   Labs 01/28/2021:  Serum glucose 92 mg, BUN 17, creatinine 0.8, EGFR 69/84 mL, CMP normal.  Hb 14.2/HCT 42.1, platelets 177, normal indicis.  Medications and allergies   Allergies  Allergen Reactions  . Adhesive [Tape] Rash  . Latex Rash     Current Outpatient Medications:  .  aspirin EC 81 MG tablet, Take 81 mg by mouth daily with supper., Disp: , Rfl:  .  atenolol (TENORMIN) 50 MG tablet, Take 1 tablet by mouth  every morning.   HOLD if BP less then 120/80. (Patient taking differently: Take 50 mg by mouth at bedtime. HOLD if BP less then 120/80.), Disp: 90 tablet, Rfl: 0 .  atorvastatin (LIPITOR) 20 MG tablet, Take 1 tablet (20 mg total) by mouth at bedtime. (Patient taking differently: Take 20 mg by mouth daily with supper.), Disp: 90 tablet, Rfl: 3 .  benazepril (LOTENSIN) 20 MG tablet, Take 20 mg by mouth daily., Disp: , Rfl:  .  fluticasone (FLONASE) 50 MCG/ACT nasal spray, daily as needed., Disp: , Rfl:  .  loratadine (CLARITIN) 10 MG tablet, Take 10 mg by mouth daily as needed for allergies., Disp: , Rfl:  .  triamterene-hydrochlorothiazide (MAXZIDE-25) 37.5-25 MG tablet, Take 1 tablet by mouth  every morning, Disp: 90 tablet, Rfl: 0 .  zolpidem (AMBIEN) 10 MG tablet, Take 5 mg by mouth at bedtime as needed for sleep. , Disp: , Rfl:     Radiology:   CT of the abdomen 05/06/2011: had revealed faint coronary artery calcification and mitral annular calcification.  CT angiogram extracranial vessel 03/02/2017: 1. CTA head and neck is negative for aneurysm. 2.  Normal for age CT appearance of the brain. 3. There is atherosclerosis resulting in mild to moderate stenosis at the right ICA bulb (50-60%), the  right vertebral artery origin, and the left vertebral artery V4 segment. 4. No other extra- or intracranial stenosis. Mild generalized arterial tortuosity.  Cardiac Studies:   Nuclear stress test 01/27/2016: 1. Resting EKG demonstrated normal sinus rhythm, normal axis. Low voltage complexes, poor R-wave progression. Stress EKG is nondiagnostic for ischemia as it is a pharmacologic stress test. Stress symptoms included dizziness. 2. Myocardial perfusion imaging  is normal. Overall left ventricular systolic function was normal without regional wall motion abnormalities. The left ventricular ejection fraction was 57%.  Carotid artery duplex  01/10/2020:  Minimal stenosis in the right internal carotid artery (minimal) with heterogeneous plaque.  Minimal stenosis in the left internal carotid artery (1-15%) with heterogeneous plaque.  Antegrade right vertebral artery flow. Antegrade left vertebral artery Flow.  Follow up studies when clinically indicated.   Echocardiogram 12/26/2020: Normal LV systolic function with visual EF 60-65%. Left ventricle cavity is normal in size. Normal global wall motion. Indeterminate diastolic filling pattern, elevated LAP. Trace aortic regurgitation. Mild (Grade I) mitral regurgitation. Moderate calcification of the mitral valve annulus. Mild tricuspid regurgitation. Mild pulmonary hypertension. RVSP measures 44 mmHg. Mild pulmonic regurgitation. Compared to prior study dated 12/20/2015 no significant change.    EKG  EKG 12/02/2022: Normal sinus rhythm heart rate of 63 bpm, normal EKG.  Compared to 02/04/2021, no significant change.  Assessment     ICD-10-CM   1. Mild mitral regurgitation  I34.0 EKG 12-Lead    PCV ECHOCARDIOGRAM COMPLETE    2. Mild tricuspid regurgitation  I07.1 PCV ECHOCARDIOGRAM COMPLETE    3. Primary hypertension  I10     4. HYPERCHOLESTEROLEMIA  E78.00     5. Atherosclerosis of both carotid arteries  I65.23       No orders of the  defined types were placed in this encounter.   There are no discontinued medications.   Recommendations:   Mrs Fay Glatt is a 82 y.o.  Asian [place] female with a history of non-rheumatic mild mitral regurgitation,  hypertension, hyperlipidemia, and hyperglycemia.  She presents for follow-up and management of HTN and valvualar heart disease.  She called our office stating that she feels like she could hear heart murmur for the last 2 days.  They brought her in to be seen on a semiurgent basis.  1. Mild mitral regurgitation On auscultation, mitral regurgitation appears to be at least moderate, with a slightly accentuated murmur compared to previous.  Will repeat echocardiogram.  2. Mild tricuspid regurgitation Could not appreciate tricuspid regurgitation murmur.  No JVD.  No clinical evidence of heart failure.  She has not had any fever or chills.  No leg edema.  3. Primary hypertension Blood pressure under excellent control.  Home blood pressure recordings have been excellent and also just a visit last month blood pressure is very well-controlled on epic EHR, patient was very emotional today as her husband has been diagnosed with liver cancer.  EKG reveals normal sinus rhythm.  Renal function is normal.  4. HYPERCHOLESTEROLEMIA In view of carotid atherosclerosis, she is presently on lipid-lowering therapy, reviewed Labs, lipids under excellent control as well.  5. Atherosclerosis of both carotid arteries As dictated above, carotid disease is very mild and she is presently on statin and also aspirin, continue the same.  Unless I see significant abnormality on echocardiogram, I will see her back in a year.    Adrian Prows, MD, [facility] 12/02/2022, 11:28 AM Office: [phone] Pager: [phone]

## 2022-12-03 ENCOUNTER — Ambulatory Visit: Payer: Medicare Other | Admitting: Cardiology

## 2022-12-17 ENCOUNTER — Encounter (HOSPITAL_BASED_OUTPATIENT_CLINIC_OR_DEPARTMENT_OTHER): Payer: Self-pay | Admitting: Obstetrics & Gynecology

## 2023-01-06 ENCOUNTER — Telehealth (HOSPITAL_BASED_OUTPATIENT_CLINIC_OR_DEPARTMENT_OTHER): Payer: Self-pay | Admitting: *Deleted

## 2023-01-06 NOTE — Telephone Encounter (Signed)
Called pt to let her know that it will be after August before she can be seen by urogynecology. Pt states that her issue has been taken care of.

## 2023-01-12 ENCOUNTER — Other Ambulatory Visit: Payer: Medicare Other

## 2023-02-05 ENCOUNTER — Ambulatory Visit: Payer: Medicare Other | Admitting: Cardiology

## 2023-08-04 ENCOUNTER — Ambulatory Visit: Payer: Medicare Other | Admitting: Podiatry

## 2023-08-04 ENCOUNTER — Ambulatory Visit (INDEPENDENT_AMBULATORY_CARE_PROVIDER_SITE_OTHER): Payer: Medicare Other

## 2023-08-04 ENCOUNTER — Encounter: Payer: Self-pay | Admitting: Podiatry

## 2023-08-04 DIAGNOSIS — M7752 Other enthesopathy of left foot: Secondary | ICD-10-CM

## 2023-08-04 DIAGNOSIS — M2022 Hallux rigidus, left foot: Secondary | ICD-10-CM

## 2023-08-04 MED ORDER — TRIAMCINOLONE ACETONIDE 10 MG/ML IJ SUSP
10.0000 mg | Freq: Once | INTRAMUSCULAR | Status: AC
  Administered 2023-08-04: 10 mg via INTRA_ARTICULAR

## 2023-08-05 NOTE — Progress Notes (Signed)
Subjective:   Patient ID: [name] Hamilton, female   DOB: 82 y.o.   MRN: [id number]   HPI Patient presents stating she has had a lot of pain around the left foot with inflammation around the lateral side of it.  Also is concerned about history of having had a cyst removed by Dr. Lauretta Chester number of years ago.  Patient does not smoke likes to be active   Review of Systems  All other systems reviewed and are negative.       Objective:  Physical Exam Vitals and nursing note reviewed.  Constitutional:      Appearance: She is well-developed.  Pulmonary:     Effort: Pulmonary effort is normal.  Musculoskeletal:        General: Normal range of motion.  Skin:    General: Skin is warm.  Neurological:     Mental Status: She is alert.     Neurovascular status intact muscle strength was found to be adequate range of motion adequate there is mild reduction of motion around the first MPJ left foot there is inflammation pain around the joint surface with fluid around it and painful when pressed.  Patient is noted to have good digital perfusion well oriented x 3     Assessment:  Inflammatory capsulitis of the first MPJ left fluid buildup around the joint with pain     Plan:  H&P reviewed at this point I have recommended treating it conservatively I discussed rigid bottom shoes and I carefully injected around the first MPJ periarticular 3 mg dexamethasone Kenalog 5 mg Xylocaine and advised on anti-inflammatories as needed.  Patient will be seen back to recheck  X-rays indicate that there is some change around the lateral side of the joint surface but no indications of significant arthritic condition

## 2023-12-02 ENCOUNTER — Ambulatory Visit (HOSPITAL_BASED_OUTPATIENT_CLINIC_OR_DEPARTMENT_OTHER): Payer: Medicare Other | Admitting: Obstetrics & Gynecology

## 2023-12-02 ENCOUNTER — Telehealth (HOSPITAL_BASED_OUTPATIENT_CLINIC_OR_DEPARTMENT_OTHER): Payer: Self-pay | Admitting: Obstetrics & Gynecology

## 2023-12-02 NOTE — Telephone Encounter (Signed)
 Called patient and left a message to call the office back to reschedule the  missed appointment .

## 2023-12-02 NOTE — Progress Notes (Deleted)
   ANNUAL EXAM Patient name: [name] Hamilton MRN [id number]  Date of birth: [birth year] Chief Complaint:   No chief complaint on file.  History of Present Illness:   [name] Hamilton is a 83 y.o. G3P2 {race:25618} female being seen today for a routine annual exam.  Current complaints: ***  No LMP recorded. Patient is postmenopausal.   The pregnancy intention screening data noted above was reviewed. Potential methods of contraception were discussed. The patient elected to proceed with No data recorded.   Last pap 09/03/2021. Results were: NILM w/ HRHPV not done. H/O abnormal pap: {yes/yes***/no:23866} Last mammogram: 10/20/2021. Results were: normal. Family h/o breast cancer: {yes***/no:23838} Last colonoscopy: 10/22/2022. Results were: {normal, abnormal, n/a:23837}. Family h/o colorectal cancer: {yes***/no:23838}     09/03/2021    3:18 PM  Depression screen PHQ 2/9  Decreased Interest 0  Down, Depressed, Hopeless 0  PHQ - 2 Score 0         No data to display           Review of Systems:   Pertinent items are noted in HPI Denies any headaches, blurred vision, fatigue, shortness of breath, chest pain, abdominal pain, abnormal vaginal discharge/itching/odor/irritation, problems with periods, bowel movements, urination, or intercourse unless otherwise stated above. Pertinent History Reviewed:  Reviewed past medical,surgical, social and family history.  Reviewed problem list, medications and allergies. Physical Assessment:  There were no vitals filed for this visit.There is no height or weight on file to calculate BMI.        Physical Examination:   General appearance - well appearing, and in no distress  Mental status - alert, oriented to person, place, and time  Psych:  She has a normal mood and affect  Skin - warm and dry, normal color, no suspicious lesions noted  Chest - effort normal, all lung fields clear to auscultation bilaterally  Heart - normal rate and regular  rhythm  Neck:  midline trachea, no thyromegaly or nodules  Breasts - breasts appear normal, no suspicious masses, no skin or nipple changes or  axillary nodes  Abdomen - soft, nontender, nondistended, no masses or organomegaly  Pelvic - VULVA: normal appearing vulva with no masses, tenderness or lesions  VAGINA: normal appearing vagina with normal color and discharge, no lesions  CERVIX: normal appearing cervix without discharge or lesions, no CMT  Thin prep pap is {Desc; done/not:10129} *** HR HPV cotesting  UTERUS: uterus is felt to be normal size, shape, consistency and nontender   ADNEXA: No adnexal masses or tenderness noted.  Rectal - normal rectal, good sphincter tone, no masses felt. Hemoccult: ***  Extremities:  No swelling or varicosities noted  Chaperone present for exam  No results found for this or any previous visit (from the past 24 hours).  Assessment & Plan:  1) Well-Woman Exam  2) ***  Labs/procedures today: ***  Mammogram: {Mammo f/u:25212::"@ 83yo"}, or sooner if problems Colonoscopy: {TCS f/u:25213::"@ 83yo"}, or sooner if problems  No orders of the defined types were placed in this encounter.   Meds: No orders of the defined types were placed in this encounter.   Follow-up: No follow-ups on file.  Sigmund Hazel, CMA 12/02/2023 1:08 PM

## 2023-12-03 ENCOUNTER — Telehealth: Payer: Self-pay | Admitting: Cardiology

## 2023-12-03 ENCOUNTER — Ambulatory Visit: Payer: Medicare Other | Attending: Cardiology | Admitting: Cardiology

## 2023-12-03 ENCOUNTER — Encounter: Payer: Self-pay | Admitting: Cardiology

## 2023-12-03 VITALS — BP 110/64 | HR 67 | Resp 16 | Ht 60.0 in | Wt 123.2 lb

## 2023-12-03 DIAGNOSIS — I340 Nonrheumatic mitral (valve) insufficiency: Secondary | ICD-10-CM | POA: Diagnosis not present

## 2023-12-03 DIAGNOSIS — I10 Essential (primary) hypertension: Secondary | ICD-10-CM | POA: Diagnosis not present

## 2023-12-03 DIAGNOSIS — E7800 Pure hypercholesterolemia, unspecified: Secondary | ICD-10-CM | POA: Diagnosis not present

## 2023-12-03 DIAGNOSIS — I071 Rheumatic tricuspid insufficiency: Secondary | ICD-10-CM | POA: Diagnosis not present

## 2023-12-03 MED ORDER — TRIAMTERENE-HCTZ 37.5-25 MG PO TABS: 1.0000 | ORAL_TABLET | ORAL | 3 refills | Status: AC

## 2023-12-03 NOTE — Telephone Encounter (Signed)
 [name] Hamilton is the one that called not patient. Please see below

## 2023-12-03 NOTE — Progress Notes (Signed)
 Cardiology Office Note:  .   Date:  12/05/2023  ID:  [name] Hamilton, DOB September 17, 1941, MRN [id number] PCP: [name] Decamp, MD  [facility] Providers Cardiologist:  None   History of Present Illness: .   [name] Hamilton is a 83 y.o.  Asian [place] female with a history of non-rheumatic mild mitral regurgitation, mild TR and mild to moderate pulm hypertension, primary hypertension, hyperlipidemia, and hyperglycemia.  She presents for follow-up and management of HTN and valvualar heart disease.   Discussed the use of AI scribe software for clinical note transcription with the patient, who gave verbal consent to proceed.  History of Present Illness   The patient, with a history of hypertension managed with amlodipine and triamterene hydrochlorothiazide, presents with recent onset of cramps in the groin and numbness in the hands. The numbness is described as a sensation of blood rushing followed by difficulty making a fist. The patient also reports a tingling sensation in the right hand. These symptoms have been intermittent and have resolved on her own. The patient has seen another doctor for these symptoms, who found no issues with blood flow or blood pressure. The patient also mentions recent stress due to the passing of a family member and the subsequent handling of her affairs.     Labs   External Labs:  [facility] labs 05/04/2023:  Total cholesterol 186, triglycerides 65, HDL 92, LDL 81.  TSH normal at 2.420.  Review of Systems  Cardiovascular:  Negative for chest pain, dyspnea on exertion and leg swelling.   Physical Exam:   VS:  BP 110/64 (BP Location: Left Arm, Patient Position: Sitting, Cuff Size: Normal)   Pulse 67   Resp 16   Ht 5' (1.524 m)   Wt 123 lb 3.2 oz (55.9 kg)   SpO2 98%   BMI 24.06 kg/m    Wt Readings from Last 3 Encounters:  12/03/23 123 lb 3.2 oz (55.9 kg)  12/02/22 123 lb (55.8 kg)  11/09/22 122 lb 6.4 oz (55.5 kg)    Physical Exam Neck:     Vascular: No JVD.   Cardiovascular:     Rate and Rhythm: Normal rate and regular rhythm.     Pulses: Intact distal pulses.     Heart sounds: S1 normal and S2 normal. Murmur heard.     Early systolic murmur is present with a grade of 2/6 at the upper right sternal border radiating to the neck.     No gallop.  Pulmonary:     Effort: Pulmonary effort is normal.     Breath sounds: Normal breath sounds.  Abdominal:     General: Bowel sounds are normal.     Palpations: Abdomen is soft.  Musculoskeletal:     Right lower leg: No edema.     Left lower leg: No edema.    EKG:    EKG Interpretation Date/Time:  Friday December 03 2023 13:30:42 EST Ventricular Rate:  68 PR Interval:  166 QRS Duration:  72 QT Interval:  426 QTC Calculation: 452 R Axis:   37  Text Interpretation: EKG 12/03/2023: Normal sinus rhythm at rate of 68 bpm, normal EKG.  PACs (1). Confirmed by Delrae Rend [phone]) on 12/03/2023 1:34:29 PM    Medications and allergies    Allergies  Allergen Reactions   Adhesive [Tape] Rash   Latex Rash     Current Outpatient Medications:    amLODipine (NORVASC) 5 MG tablet, Take 5 mg by mouth daily., Disp: , Rfl:  aspirin EC 81 MG tablet, Take 81 mg by mouth daily with supper., Disp: , Rfl:    atenolol (TENORMIN) 50 MG tablet, Take 1 tablet by mouth  every morning.   HOLD if BP less then 120/80. (Patient taking differently: Take 50 mg by mouth at bedtime. HOLD if BP less then 120/80.), Disp: 90 tablet, Rfl: 0   atorvastatin (LIPITOR) 20 MG tablet, Take 1 tablet (20 mg total) by mouth at bedtime. (Patient taking differently: Take 20 mg by mouth daily with supper.), Disp: 90 tablet, Rfl: 3   benazepril (LOTENSIN) 20 MG tablet, Take 20 mg by mouth daily., Disp: , Rfl:    fluticasone (FLONASE) 50 MCG/ACT nasal spray, daily as needed., Disp: , Rfl:    triamterene-hydrochlorothiazide (MAXZIDE-25) 37.5-25 MG tablet, Take 1 tablet by mouth as directed. Take 0.5 tab daily, Disp: 100 tablet, Rfl: 3    zolpidem (AMBIEN) 10 MG tablet, Take 5 mg by mouth at bedtime as needed for sleep. , Disp: , Rfl:    loratadine (CLARITIN) 10 MG tablet, Take 10 mg by mouth daily as needed for allergies. (Patient not taking: Reported on 12/03/2023), Disp: , Rfl:    ASSESSMENT AND PLAN: .      ICD-10-CM   1. Mild mitral regurgitation  I34.0 EKG 12-Lead    2. Mild tricuspid regurgitation  I07.1     3. Primary hypertension  I10 triamterene-hydrochlorothiazide (MAXZIDE-25) 37.5-25 MG tablet    4. HYPERCHOLESTEROLEMIA  E78.00       1. Mild mitral regurgitation Patient has mild MR and mild TR with mild to moderate pulm hypertension, however there has been no clinical evidence of heart failure, she remains completely asymptomatic.  Physical examination except for a soft murmur I do not hear heart gallop, lungs are clear, no clinical evidence of heart failure and she denies any dyspnea or leg edema.  Hence no further evaluation is indicated with regard to valvular heart disease especially in view of no change in physical exam, EKG and also remains asymptomatic.  2. Mild tricuspid regurgitation As dictated above  3. Primary hypertension Blood pressure is well-controlled on atenolol 50 mg daily, amlodipine 5 mg daily and Maxide 37.5/25 mg daily however patient has noticed episodes of dizziness and low blood pressure, she would like to try lower dose of Maxide.  Switched her from capsule to tablet and advised her to take 1/2 tablet daily and follow-up on blood pressure.  4. HYPERCHOLESTEROLEMIA Lipids are well-controlled on atorvastatin 20 mg daily, reviewed her external labs, LDL is <100.  Continue the same.  Overall she remained stable from cardiac standpoint, I will see her back on a as needed basis.   Signed,  [name] Decamp, MD, [facility] 12/05/2023, 3:59 PM [facility] [address] [place], [place] [address] Phone: [phone]. Fax:  [phone]

## 2023-12-03 NOTE — Telephone Encounter (Signed)
 Spoke with [name] and he is aare per Dr. Jacinto Hamilton she is to take 0.5 tablet daily  Maxzide 0.5 tablet daily per Dr [name] Hamilton - will you contact the pharmacy back for me please??   Thank you  [name] Hamilton

## 2023-12-03 NOTE — Patient Instructions (Signed)
 Medication Instructions:  Your physician recommends that you continue on your current medications as directed. Please refer to the Current Medication list given to you today. *If you need a refill on your cardiac medications before your next appointment, please call your pharmacy*   Follow-Up: At [facility], you and your health needs are our priority.  As part of our continuing mission to provide you with exceptional heart care, we have created designated Provider Care Teams.  These Care Teams include your primary Cardiologist (physician) and Advanced Practice Providers (APPs -  Physician Assistants and Nurse Practitioners) who all work together to provide you with the care you need, when you need it.  We recommend signing up for the patient portal called "MyChart".  Sign up information is provided on this After Visit Summary.  MyChart is used to connect with patients for Virtual Visits (Telemedicine).  Patients are able to view lab/test results, encounter notes, upcoming appointments, etc.  Non-urgent messages can be sent to your provider as well.   To learn more about what you can do with MyChart, go to [email or web address].    Your next appointment:   As Needed  Provider:   Dr Jacinto Halim

## 2023-12-03 NOTE — Telephone Encounter (Signed)
 Pt c/o medication issue:  1. Name of Medication:   triamterene-hydrochlorothiazide (MAXZIDE-25) 37.5-25 MG tablet    2. How are you currently taking this medication (dosage and times per day)?   Take 1 tablet by mouth as directed. Take 0.5 tab daily    3. Are you having a reaction (difficulty breathing--STAT)? no  4. What is your medication issue? Pharmacy calling to get clarification for direction for patient's medication. Is it 1 tablet or .5 tablet. Please advise

## 2023-12-07 ENCOUNTER — Ambulatory Visit: Payer: Self-pay | Admitting: Cardiology

## 2023-12-14 ENCOUNTER — Telehealth: Payer: Self-pay | Admitting: Cardiology

## 2023-12-14 NOTE — Telephone Encounter (Signed)
Updated medication list as requested.

## 2023-12-14 NOTE — Telephone Encounter (Signed)
 Pt c/o medication issue:  1. Name of Medication:   benazepril (LOTENSIN) 20 MG tablet   2. How are you currently taking this medication (dosage and times per day)?   Not taking  3. Are you having a reaction (difficulty breathing--STAT)?   4. What is your medication issue?   Patient called to report that this medication is still showing on her medication list and she does not take it.  Patient stated she has been taking amLODipine (NORVASC) 5 MG tablet instead.  Patient stated can leave a voice message on her phone if needed.
